# Patient Record
Sex: Female | Born: 2000
Health system: Southern US, Community
[De-identification: ages and names within clinical notes are randomized; demographics above are authoritative.]

## PROBLEM LIST (undated history)

## (undated) DIAGNOSIS — A599 Trichomoniasis, unspecified: Secondary | ICD-10-CM

## (undated) DIAGNOSIS — Z789 Other specified health status: Secondary | ICD-10-CM

## (undated) DIAGNOSIS — D649 Anemia, unspecified: Secondary | ICD-10-CM

## (undated) HISTORY — DX: Other specified health status: Z78.9

## (undated) HISTORY — PX: NO PAST SURGERIES: SHX2092

## (undated) HISTORY — DX: Trichomoniasis, unspecified: A59.9

---
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
## 2018-04-11 IMAGING — US US OB COMP LESS 14 WK
1 series · 15 of 28 positions shown · non-contrast
Comparison: None.

CLINICAL DATA: Viability, early pregnancy

EXAM:
OBSTETRIC <14 WK ULTRASOUND
TECHNIQUE: Transabdominal ultrasound was performed for evaluation of the
gestation as well as the maternal uterus and adnexal regions.

[Series 1: us ob comp less 14 wk · 15 of 31 slices shown]
[im 1/31]
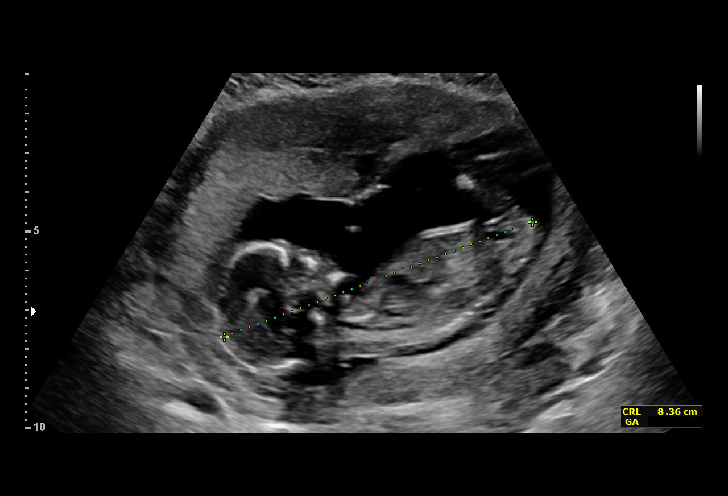
[im 3/31]
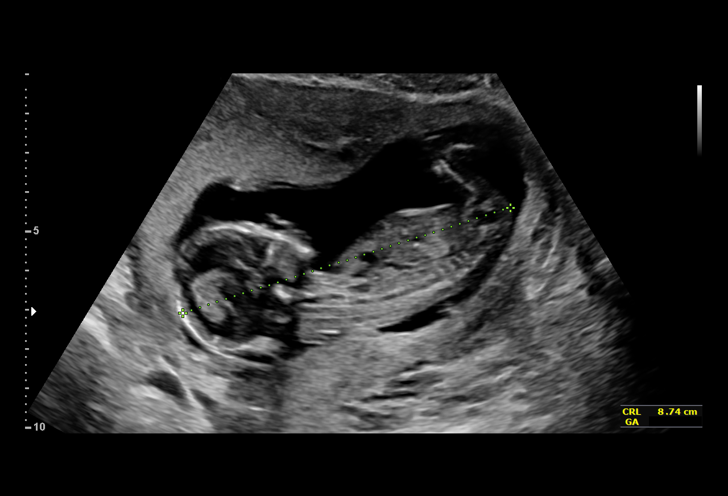
[im 5/31]
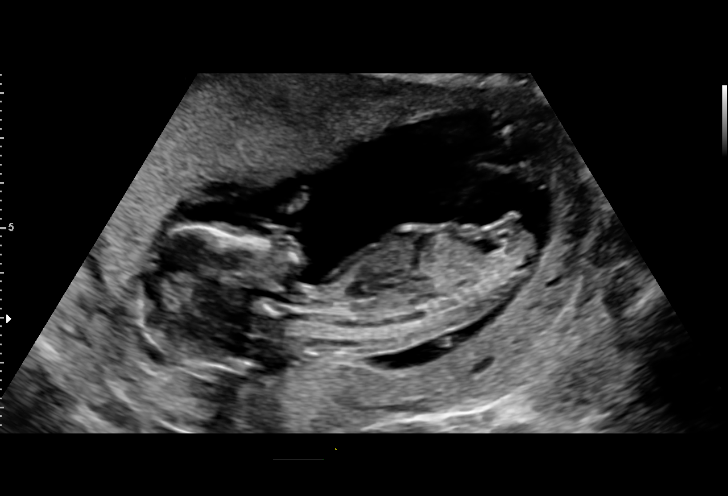
[im 7/31]
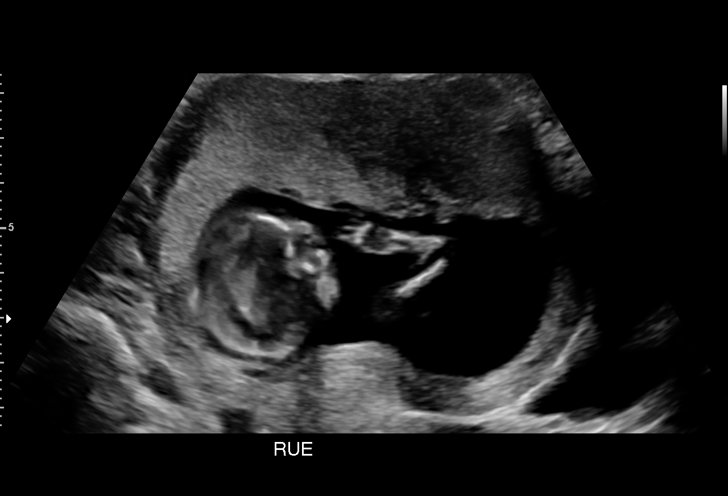
[im 9/31]
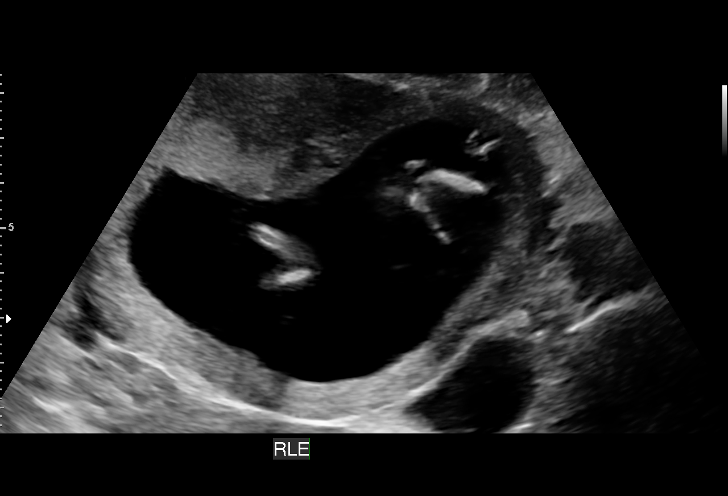
[im 12/31]
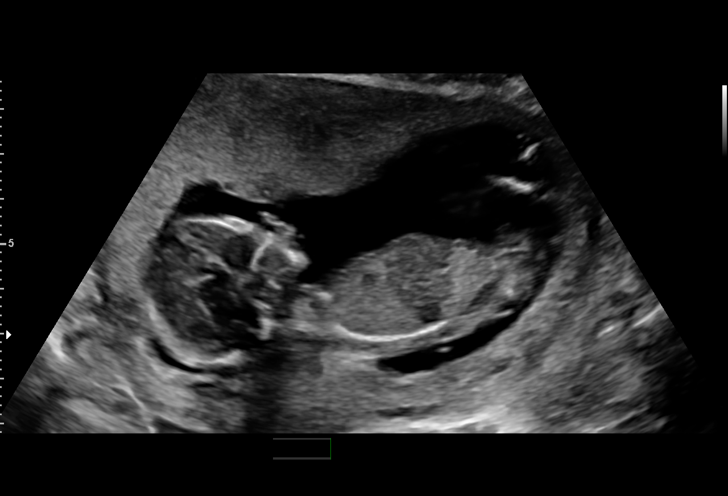
[im 14/31]
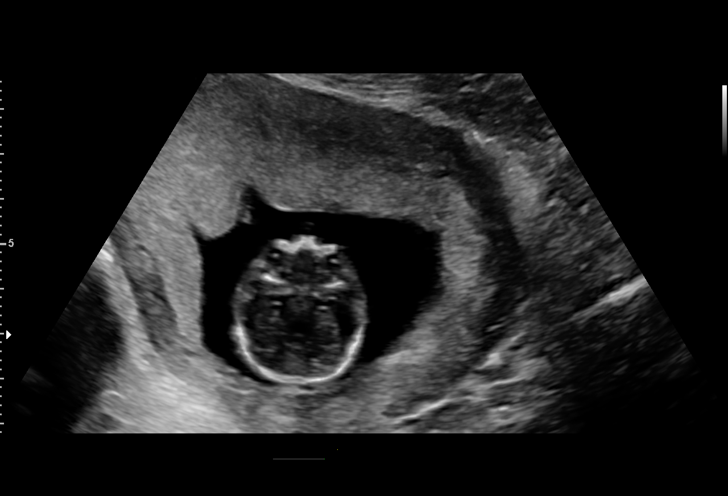
[im 16/31]
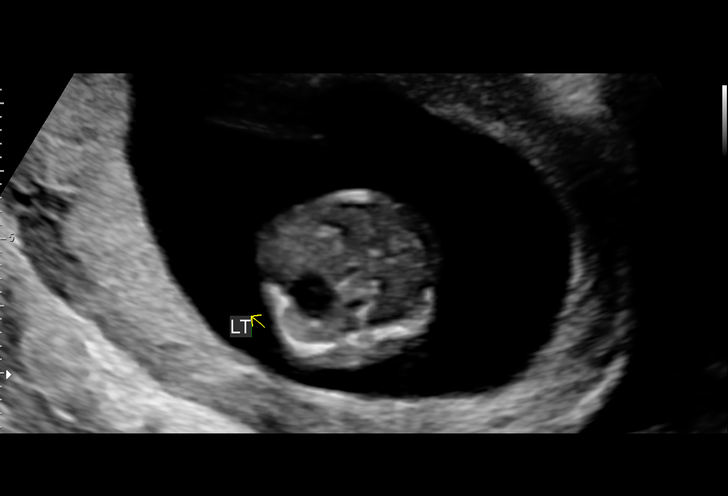
[im 17/31]
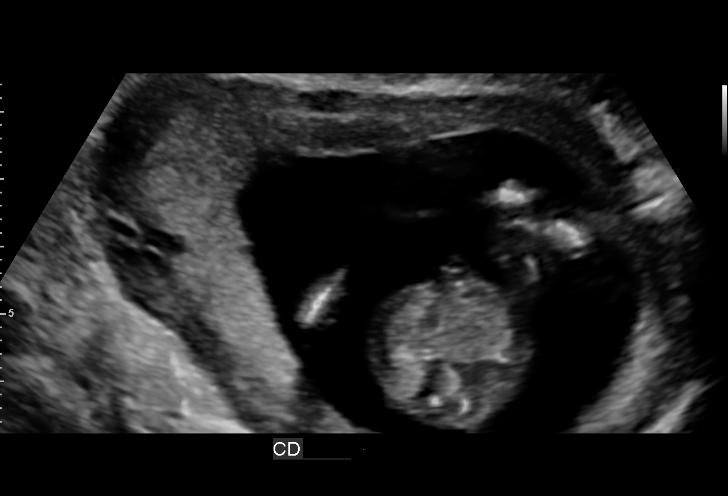
[im 19/31]
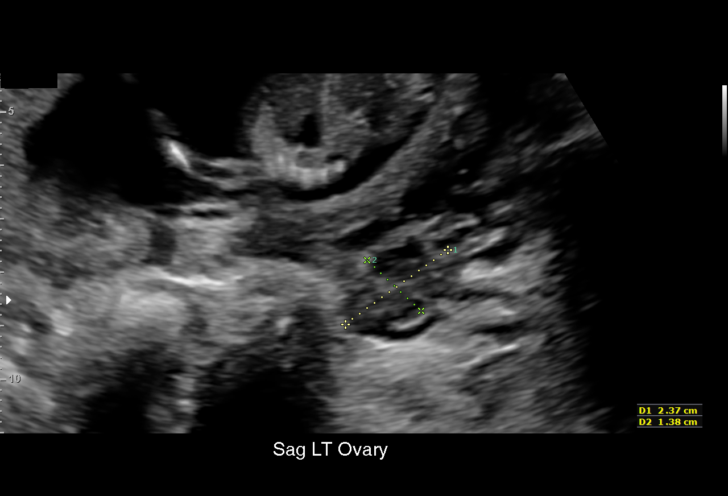
[im 22/31]
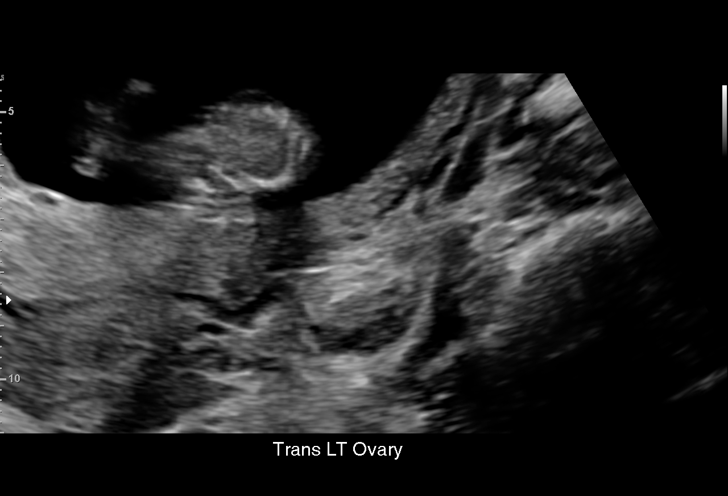
[im 24/31]
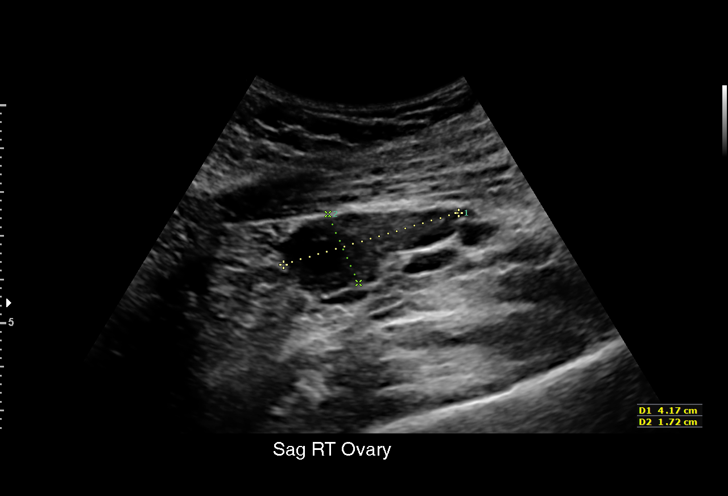
[im 26/31]
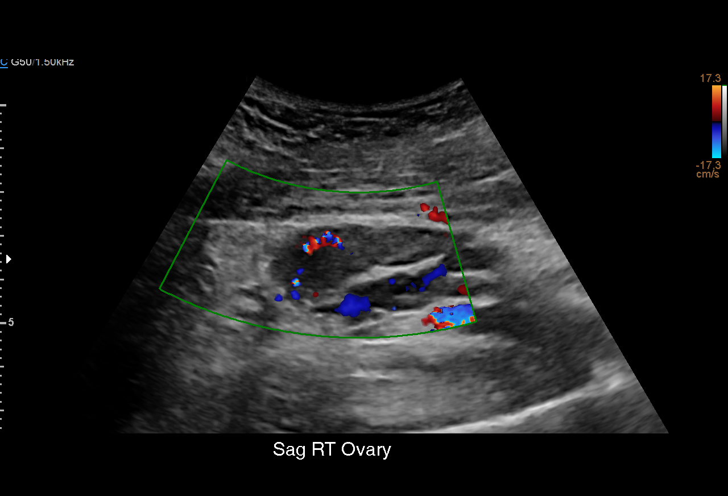
[im 28/31]
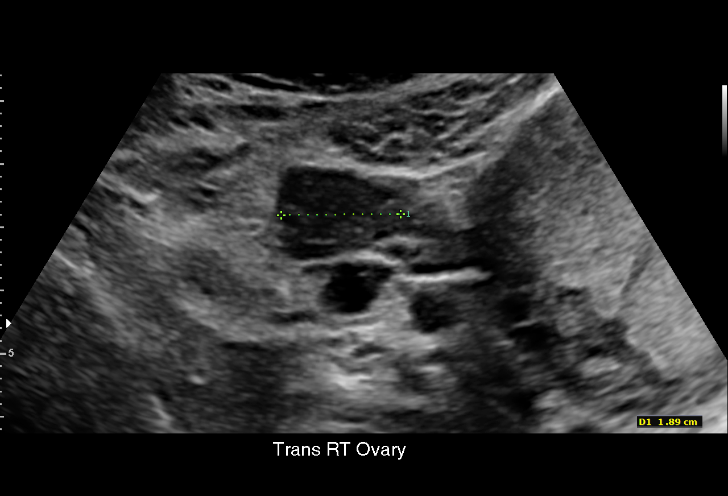
[im 31/31]
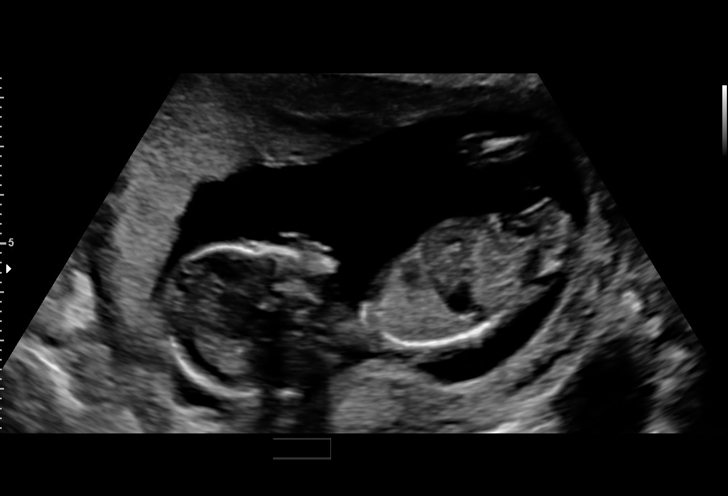

[15 of 28 positions shown; findings below may reference images not displayed]

FINDINGS: Intrauterine gestational sac: Visualized, single

Yolk sac:  Not visualized

Embryo:  Visualized

Cardiac Activity: Visualized

Heart Rate: 152 bpm

MSD:  Mm    w     d

CRL:   85.5  mm   14 w 3 d                  US EDC: 09/10/2016

Subchorionic hemorrhage:  None visualized.

Maternal uterus/adnexae: No adnexal masses or free fluid.
IMPRESSION: Fourteen week 3 day intrauterine pregnancy by crown-rump length.
Fetal heart rate 152 beats per minute. No acute maternal findings.

## 2018-05-26 IMAGING — US US MFM OB TRANSVAGINAL
1 series · 14 of 28 positions shown · non-contrast
Comparison: none

[Series 1: us mfm ob transvaginal · 14 of 102 slices shown]
[im 4/102]
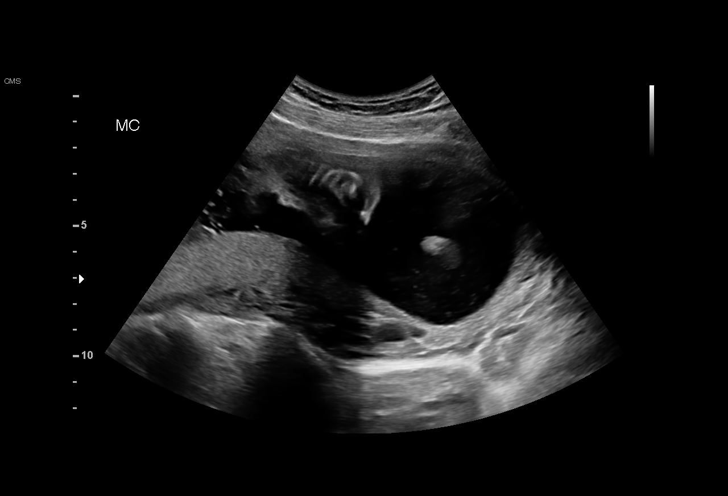
[im 12/102]
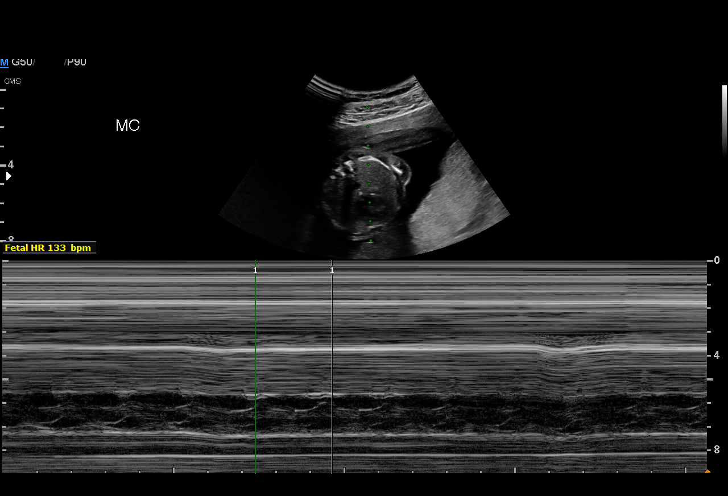
[im 19/102]
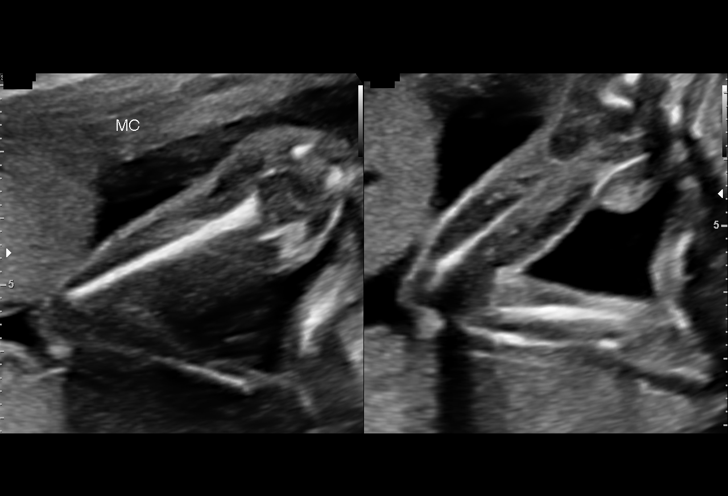
[im 27/102]
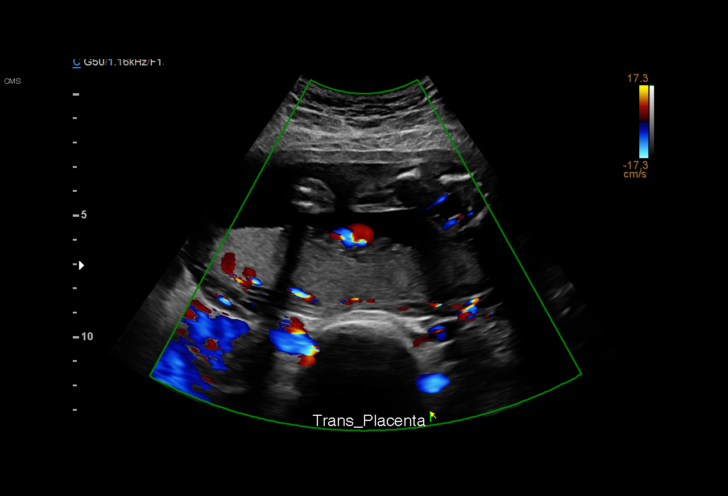
[im 34/102]
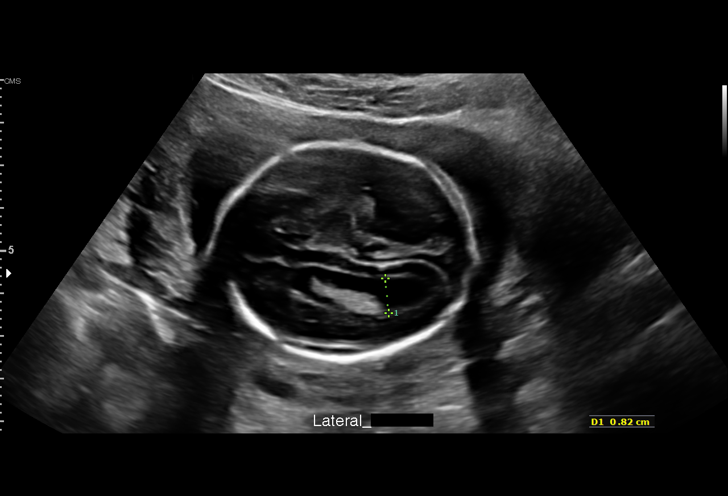
[im 42/102]
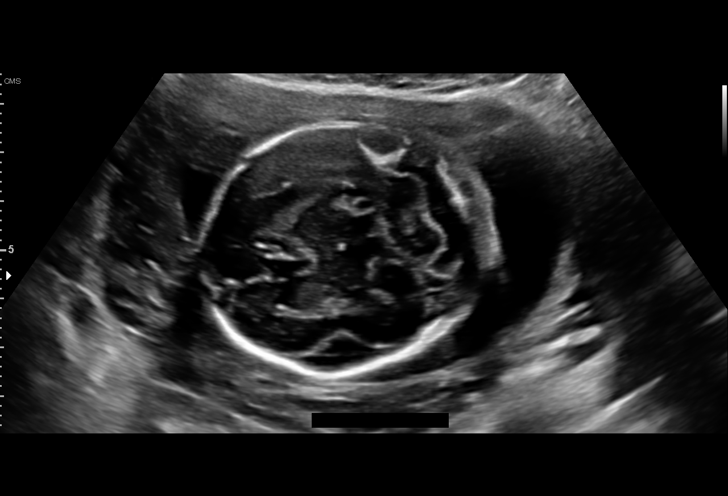
[im 49/102]
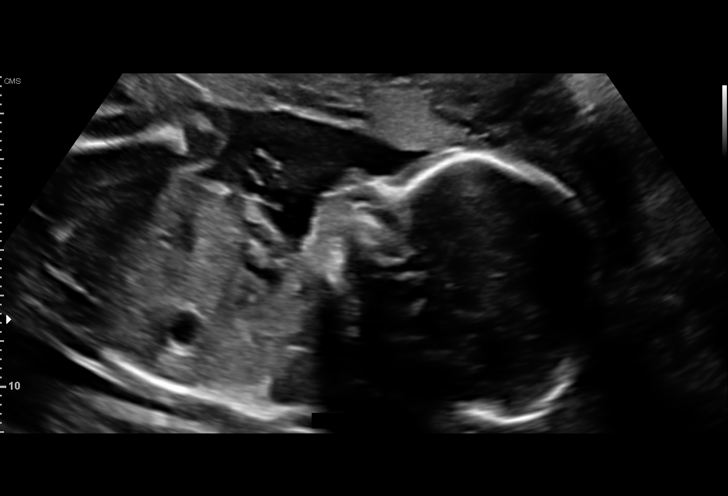
[im 57/102]
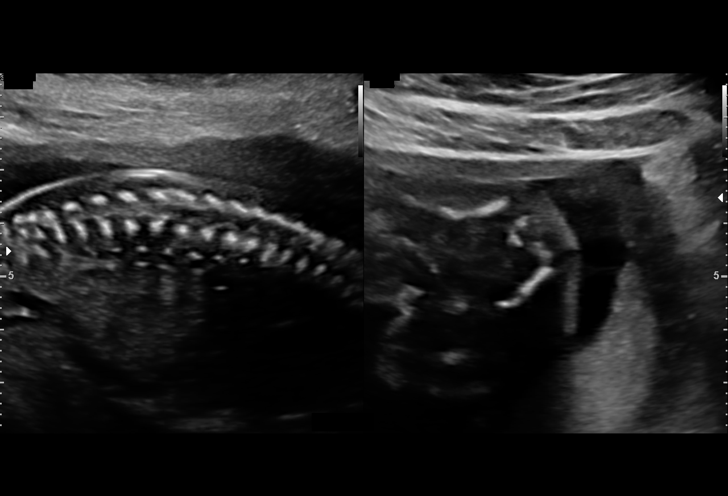
[im 64/102]
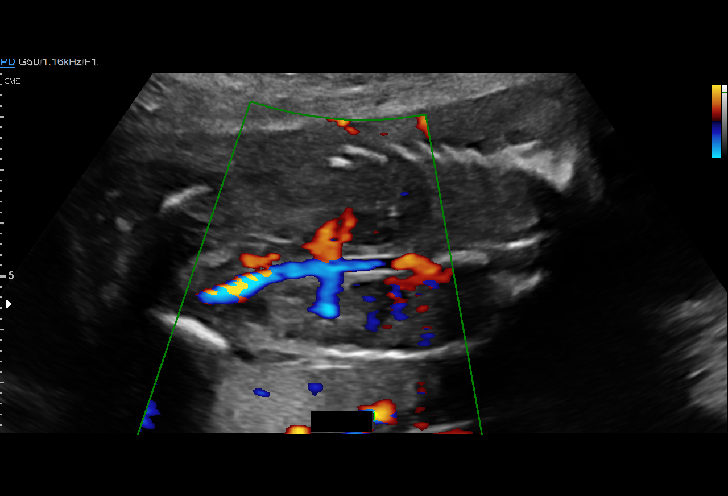
[im 72/102]
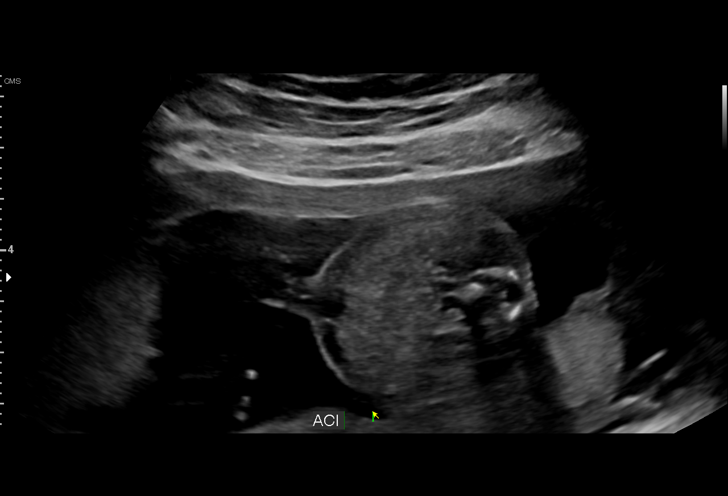
[im 79/102]
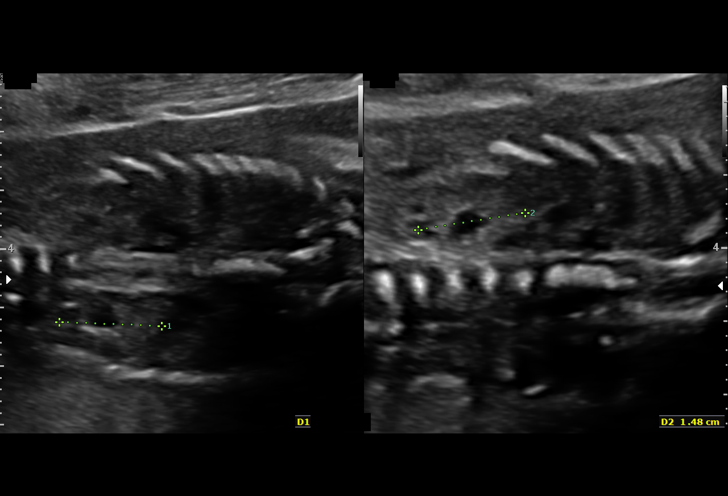
[im 87/102]
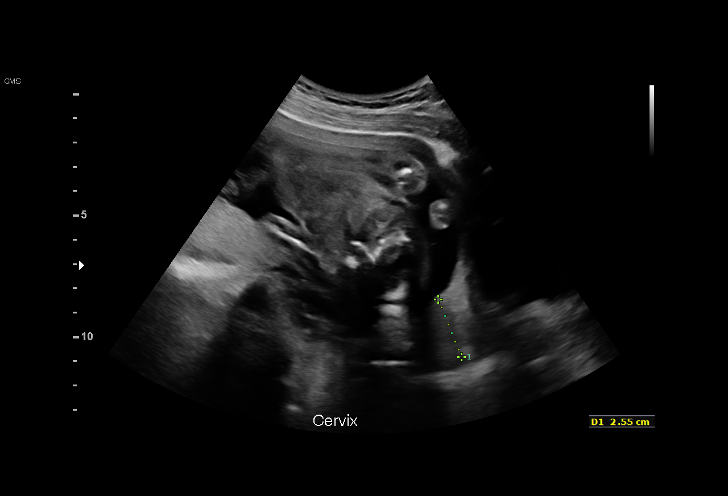
[im 94/102]
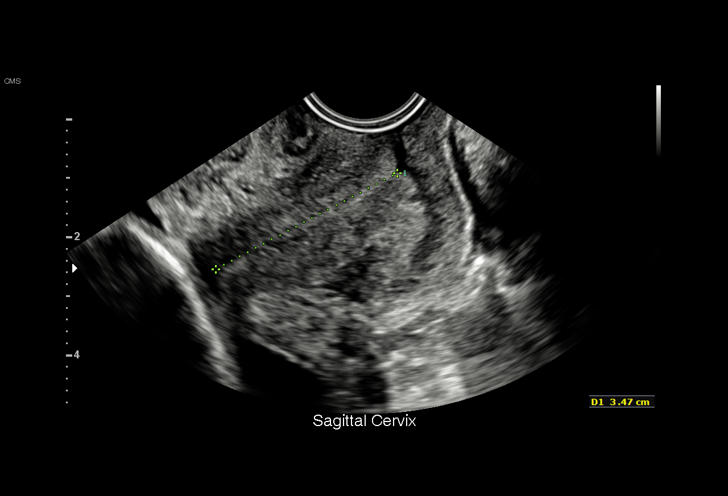
[im 102/102]
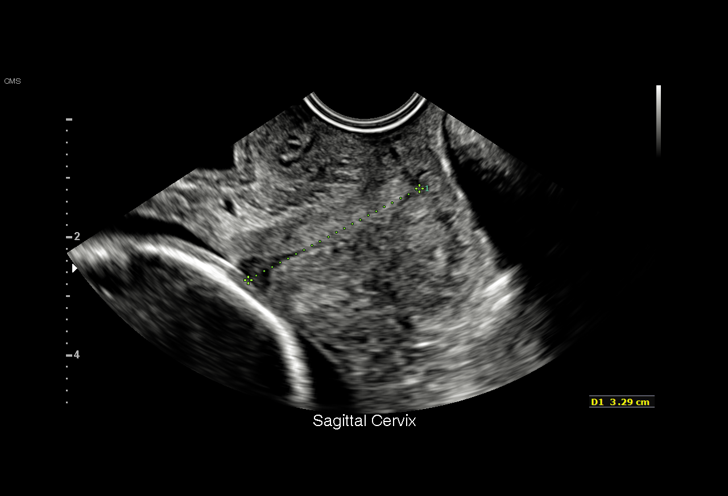

[14 of 28 positions shown; findings below may reference images not displayed]

[id number] [REDACTED]

1  DENG MAXWELL              [id number]      [id number]     [id number]
2  PAULUS N CEEJAY            [id number]      [id number]     [id number]
Indications

20 weeks gestation of pregnancy
Encounter for fetal anatomic survey
Encounter for cervical length
Fetal Evaluation

Num Of Fetuses:     1
Fetal Heart         133
Rate(bpm):
Cardiac Activity:   Observed
Presentation:       Cephalic
Placenta:           Posterior, above cervical os
P. Cord Insertion:  Visualized, central

Amniotic Fluid
AFI FV:      Subjectively within normal limits

Largest Pocket(cm)
4.19
Biometry

BPD:      49.9  mm     G. Age:  21w 0d         59  %    CI:         73.73  %    70 - 86
FL/HC:       19.0  %    15.9 -
HC:      184.6  mm     G. Age:  20w 6d         39  %    HC/AC:       1.18       1.06 -
AC:        156  mm     G. Age:  20w 6d         41  %    FL/BPD:      70.3  %
FL:       35.1  mm     G. Age:  21w 0d         49  %    FL/AC:       22.5  %    20 - 24
HUM:      32.8  mm     G. Age:  21w 0d         53  %
CER:      20.4  mm     G. Age:  19w 3d         17  %
CM:        5.1  mm

Est. FW:     386   gm    0 lb 14 oz     46  %
Gestational Age

U/S Today:     21w 0d                                        EDD:    09/09/16
Best:          20w 6d     Det. By:  Previous Ultrasound      EDD:    09/10/16
(03/15/16)
Anatomy

Cranium:               Appears normal         Aortic Arch:            Appears normal
Cavum:                 Appears normal         Ductal Arch:            Appears normal
Ventricles:            Appears normal         Diaphragm:              Appears normal
Choroid Plexus:        Appears normal         Stomach:                Appears normal, left
sided
Cerebellum:            Appears normal         Abdomen:                Appears normal
Posterior Fossa:       Appears normal         Abdominal Wall:         Appears nml (cord
insert, abd wall)
Nuchal Fold:           Not applicable (>20    Cord Vessels:           Appears normal (3
wks GA)                                        vessel cord)
Face:                  Appears normal         Kidneys:                Appear normal
(orbits and profile)
Lips:                  Appears normal         Bladder:                Appears normal
Thoracic:              Appears normal         Spine:                  Appears normal
Heart:                 Appears normal         Upper Extremities:      Appears normal
(4CH, axis, and
situs)
RVOT:                  Appears normal         Lower Extremities:      Appears normal
LVOT:                  Appears normal

Other:  Fetus appears to be a female. Heels and 5th digit visualized. Nasal
bone visualized. Open hands visualized.
Cervix Uterus Adnexa

Cervix
Length:           3.14  cm.
Normal appearance by transvaginal scan

Uterus
No abnormality visualized.

Left Ovary
Within normal limits.

Right Ovary
Within normal limits.

Cul De Sac:   No free fluid seen.

Adnexa:       No abnormality visualized.
Impression

SIUP at 20+6 weeks
Normal detailed fetal anatomy
Markers of aneuploidy: none
Normal amniotic fluid volume
Measurements consistent with prior US
EV views of cervix: normal length without funneling
Recommendations

Follow-up as clinically indicated

## 2018-06-17 ENCOUNTER — Other Ambulatory Visit (HOSPITAL_COMMUNITY): Payer: Self-pay | Admitting: Family Medicine

## 2018-06-17 LAB — PREGNANCY, URINE: Preg Test, Ur: POSITIVE — AB

## 2019-01-27 ENCOUNTER — Other Ambulatory Visit: Payer: Self-pay | Admitting: Family Medicine

## 2019-01-27 DIAGNOSIS — Z3201 Encounter for pregnancy test, result positive: Secondary | ICD-10-CM

## 2019-02-01 ENCOUNTER — Encounter: Payer: Medicaid Other | Admitting: Obstetrics & Gynecology

## 2019-02-05 ENCOUNTER — Ambulatory Visit: Payer: Medicaid Other

## 2019-02-05 ENCOUNTER — Ambulatory Visit: Admission: RE | Admit: 2019-02-05 | Payer: Self-pay | Source: Ambulatory Visit

## 2019-02-12 ENCOUNTER — Ambulatory Visit: Payer: Medicaid Other

## 2019-02-17 ENCOUNTER — Other Ambulatory Visit: Payer: Self-pay | Admitting: Family Medicine

## 2019-02-17 DIAGNOSIS — Z3687 Encounter for antenatal screening for uncertain dates: Secondary | ICD-10-CM

## 2019-02-17 DIAGNOSIS — Z3201 Encounter for pregnancy test, result positive: Secondary | ICD-10-CM

## 2019-02-17 DIAGNOSIS — Z3490 Encounter for supervision of normal pregnancy, unspecified, unspecified trimester: Secondary | ICD-10-CM

## 2019-02-18 ENCOUNTER — Other Ambulatory Visit: Payer: Self-pay

## 2019-02-18 ENCOUNTER — Ambulatory Visit
Admission: RE | Admit: 2019-02-18 | Discharge: 2019-02-18 | Disposition: A | Discharge status: Home / Self Care | Payer: Medicaid Other | Source: Ambulatory Visit | Attending: Family Medicine | Admitting: Family Medicine

## 2019-02-18 DIAGNOSIS — Z3201 Encounter for pregnancy test, result positive: Secondary | ICD-10-CM | POA: Diagnosis present

## 2019-02-18 DIAGNOSIS — Z3490 Encounter for supervision of normal pregnancy, unspecified, unspecified trimester: Secondary | ICD-10-CM | POA: Insufficient documentation

## 2019-02-18 DIAGNOSIS — Z3687 Encounter for antenatal screening for uncertain dates: Secondary | ICD-10-CM | POA: Diagnosis present

## 2019-02-23 ENCOUNTER — Other Ambulatory Visit: Payer: Self-pay

## 2019-02-23 DIAGNOSIS — Z20822 Contact with and (suspected) exposure to covid-19: Secondary | ICD-10-CM

## 2019-02-25 LAB — NOVEL CORONAVIRUS, NAA: SARS-CoV-2, NAA: NOT DETECTED

## 2019-03-19 NOTE — L&D Delivery Note (Addendum)
OB/GYN Faculty Practice Delivery Note  Kayla Poole is a 19 y.o. [id number] s/p VD at [redacted]w[redacted]d. She was admitted for SROM.Marland Kitchen   ROM: 12h 26m with clear fluid GBS Status: Negative/-- (07/01 0000) Maximum Maternal Temperature: 98.15F  Labor Progress: . Initial SVE: 3.5/70/-2. Patient received Pitocin. She then progressed to complete.   Delivery Date/Time: 7/6 @ 2306 Delivery: Called to room and patient was complete and pushing. Head delivered in ROA position. Tight nuchal cord present and reduced. Shoulder and body delivered in usual fashion. Infant with spontaneous cry, placed on mother's abdomen, dried and stimulated. Cord clamped x 2 after 1-minute delay, and cut by MGM. Cord blood drawn. Placenta delivered spontaneously with gentle cord traction. Fundus firm with massage and Pitocin. Labia, perineum, vagina, and cervix inspected inspected with no lacerations.  Baby Weight: 3445g  Placenta: Sent to L&D Complications: None Lacerations: None EBL: 221 mL Analgesia: None   Infant:  APGAR (1 MIN): 7   APGAR (5 MINS): 9   APGAR (10 MINS):     Kayla Birkenhead, MD [facility] Family Medicine Fellow, [facility] for [facility], [facility] 09/21/2019, 11:39 PM

## 2019-03-26 ENCOUNTER — Other Ambulatory Visit: Payer: Self-pay | Admitting: Family Medicine

## 2019-03-26 DIAGNOSIS — Z3481 Encounter for supervision of other normal pregnancy, first trimester: Secondary | ICD-10-CM

## 2019-04-26 ENCOUNTER — Other Ambulatory Visit: Payer: Self-pay

## 2019-04-26 ENCOUNTER — Ambulatory Visit
Admission: RE | Admit: 2019-04-26 | Discharge: 2019-04-26 | Disposition: A | Discharge status: Home / Self Care | Payer: Medicaid Other | Source: Ambulatory Visit | Attending: Family Medicine | Admitting: Family Medicine

## 2019-04-26 DIAGNOSIS — Z3481 Encounter for supervision of other normal pregnancy, first trimester: Secondary | ICD-10-CM | POA: Diagnosis not present

## 2019-09-16 LAB — OB RESULTS CONSOLE GBS: GBS: NEGATIVE

## 2019-09-21 ENCOUNTER — Inpatient Hospital Stay (HOSPITAL_COMMUNITY)
Admission: AD | Admit: 2019-09-21 | Discharge: 2019-09-23 | DRG: 807 | Disposition: A | Discharge status: Home / Self Care | Payer: Medicaid Other | Attending: Obstetrics and Gynecology | Admitting: Obstetrics and Gynecology

## 2019-09-21 ENCOUNTER — Other Ambulatory Visit: Payer: Self-pay

## 2019-09-21 ENCOUNTER — Encounter (HOSPITAL_COMMUNITY): Payer: Self-pay | Admitting: Obstetrics and Gynecology

## 2019-09-21 DIAGNOSIS — O1490 Unspecified pre-eclampsia, unspecified trimester: Secondary | ICD-10-CM

## 2019-09-21 DIAGNOSIS — O1494 Unspecified pre-eclampsia, complicating childbirth: Secondary | ICD-10-CM | POA: Diagnosis present

## 2019-09-21 DIAGNOSIS — Z20822 Contact with and (suspected) exposure to covid-19: Secondary | ICD-10-CM | POA: Diagnosis present

## 2019-09-21 DIAGNOSIS — Z3A39 39 weeks gestation of pregnancy: Secondary | ICD-10-CM

## 2019-09-21 DIAGNOSIS — Z3490 Encounter for supervision of normal pregnancy, unspecified, unspecified trimester: Secondary | ICD-10-CM

## 2019-09-21 DIAGNOSIS — O4290 Premature rupture of membranes, unspecified as to length of time between rupture and onset of labor, unspecified weeks of gestation: Secondary | ICD-10-CM | POA: Diagnosis present

## 2019-09-21 DIAGNOSIS — O26893 Other specified pregnancy related conditions, third trimester: Secondary | ICD-10-CM | POA: Diagnosis present

## 2019-09-21 DIAGNOSIS — O4292 Full-term premature rupture of membranes, unspecified as to length of time between rupture and onset of labor: Principal | ICD-10-CM | POA: Diagnosis present

## 2019-09-21 HISTORY — DX: Anemia, unspecified: D64.9

## 2019-09-21 LAB — COMPREHENSIVE METABOLIC PANEL
ALT: 12 U/L (ref 0–44)
AST: 16 U/L (ref 15–41)
Albumin: 2.7 g/dL — ABNORMAL LOW (ref 3.5–5.0)
Alkaline Phosphatase: 194 U/L — ABNORMAL HIGH (ref 38–126)
Anion gap: 12 (ref 5–15)
BUN: 6 mg/dL (ref 6–20)
CO2: 18 mmol/L — ABNORMAL LOW (ref 22–32)
Calcium: 8.6 mg/dL — ABNORMAL LOW (ref 8.9–10.3)
Chloride: 107 mmol/L (ref 98–111)
Creatinine, Ser: 0.45 mg/dL (ref 0.44–1.00)
GFR calc Af Amer: >60 mL/min (ref >60)
GFR calc non Af Amer: >60 mL/min (ref >60)
Glucose, Bld: 79 mg/dL (ref 70–99)
Potassium: 3.6 mmol/L (ref 3.5–5.1)
Sodium: 137 mmol/L (ref 135–145)
Total Bilirubin: 0.5 mg/dL (ref 0.3–1.2)
Total Protein: 6.2 g/dL — ABNORMAL LOW (ref 6.5–8.1)

## 2019-09-21 LAB — TYPE AND SCREEN
ABO/RH(D): O POS
Antibody Screen: NEGATIVE

## 2019-09-21 LAB — CBC
HCT: 30.7 % — ABNORMAL LOW (ref 36.0–46.0)
Hemoglobin: 8.9 g/dL — ABNORMAL LOW (ref 12.0–15.0)
MCH: 22.5 pg — ABNORMAL LOW (ref 26.0–34.0)
MCHC: 29 g/dL — ABNORMAL LOW (ref 30.0–36.0)
MCV: 77.7 fL — ABNORMAL LOW (ref 80.0–100.0)
Platelets: 275 10*3/uL (ref 150–400)
RBC: 3.95 MIL/uL (ref 3.87–5.11)
RDW: 18 % — ABNORMAL HIGH (ref 11.5–15.5)
WBC: 14.5 10*3/uL — ABNORMAL HIGH (ref 4.0–10.5)
nRBC: 0.2 % (ref 0.0–0.2)

## 2019-09-21 LAB — PROTEIN / CREATININE RATIO, URINE
Creatinine, Urine: 126.54 mg/dL
Protein Creatinine Ratio: 0.36 mg/mg{Cre} — ABNORMAL HIGH (ref 0.00–0.15)
Total Protein, Urine: 46 mg/dL

## 2019-09-21 LAB — SARS CORONAVIRUS 2 BY RT PCR (HOSPITAL ORDER, PERFORMED IN ~~LOC~~ HOSPITAL LAB): SARS Coronavirus 2: NEGATIVE

## 2019-09-21 LAB — ABO/RH: ABO/RH(D): O POS

## 2019-09-21 LAB — POCT FERN TEST: POCT Fern Test: POSITIVE

## 2019-09-21 MED ORDER — OXYTOCIN-SODIUM CHLORIDE 30-0.9 UT/500ML-% IV SOLN
1.0000 m[IU]/min | INTRAVENOUS | Status: DC
  Administered 2019-09-21: 2 m[IU]/min via INTRAVENOUS

## 2019-09-21 MED ORDER — LACTATED RINGERS IV SOLN: INTRAVENOUS | Status: DC

## 2019-09-21 MED ORDER — SOD CITRATE-CITRIC ACID 500-334 MG/5ML PO SOLN: 30.0000 mL | ORAL | Status: DC | PRN

## 2019-09-21 MED ORDER — LIDOCAINE HCL (PF) 1 % IJ SOLN: 30.0000 mL | INTRAMUSCULAR | Status: DC | PRN

## 2019-09-21 MED ORDER — OXYTOCIN BOLUS FROM INFUSION
333.0000 mL | Freq: Once | INTRAVENOUS | Status: AC
  Administered 2019-09-21: 333 mL via INTRAVENOUS

## 2019-09-21 MED ORDER — FENTANYL CITRATE (PF) 100 MCG/2ML IJ SOLN
50.0000 ug | INTRAMUSCULAR | Status: DC | PRN
  Administered 2019-09-21 (×2): 50 ug via INTRAVENOUS
  Filled 2019-09-21 (×2): qty 2

## 2019-09-21 MED ORDER — DIPHENHYDRAMINE HCL 50 MG/ML IJ SOLN: 12.5000 mg | INTRAMUSCULAR | Status: DC | PRN

## 2019-09-21 MED ORDER — LACTATED RINGERS IV SOLN: 500.0000 mL | INTRAVENOUS | Status: DC | PRN

## 2019-09-21 MED ORDER — ONDANSETRON HCL 4 MG/2ML IJ SOLN: 4.0000 mg | Freq: Four times a day (QID) | INTRAMUSCULAR | Status: DC | PRN

## 2019-09-21 MED ORDER — EPHEDRINE 5 MG/ML INJ: 10.0000 mg | INTRAVENOUS | Status: DC | PRN

## 2019-09-21 MED ORDER — LACTATED RINGERS IV SOLN: 500.0000 mL | Freq: Once | INTRAVENOUS | Status: DC

## 2019-09-21 MED ORDER — PHENYLEPHRINE 40 MCG/ML (10ML) SYRINGE FOR IV PUSH (FOR BLOOD PRESSURE SUPPORT): 80.0000 ug | PREFILLED_SYRINGE | INTRAVENOUS | Status: DC | PRN

## 2019-09-21 MED ORDER — ACETAMINOPHEN 325 MG PO TABS: 650.0000 mg | ORAL_TABLET | ORAL | Status: DC | PRN

## 2019-09-21 MED ORDER — TERBUTALINE SULFATE 1 MG/ML IJ SOLN: 0.2500 mg | Freq: Once | INTRAMUSCULAR | Status: DC | PRN

## 2019-09-21 MED ORDER — FENTANYL-BUPIVACAINE-NACL 0.5-0.125-0.9 MG/250ML-% EP SOLN: 12.0000 mL/h | EPIDURAL | Status: DC | PRN

## 2019-09-21 MED ORDER — OXYTOCIN-SODIUM CHLORIDE 30-0.9 UT/500ML-% IV SOLN
2.5000 [IU]/h | INTRAVENOUS | Status: DC
  Administered 2019-09-21: 2.5 [IU]/h via INTRAVENOUS
  Filled 2019-09-21: qty 500

## 2019-09-21 NOTE — Progress Notes (Signed)
Labor Progress Note Kayla Poole is a 19 y.o. G3P2002 at 35w5dpresented for SROM. S: Starting to feel ctx. Met patient and discussed plan.   O:  BP 123/85   Pulse 98   Temp 98.4 F (36.9 C) (Oral)   Resp 16   Ht _0  (1.727 m)   Wt 86.7 kg   LMP  (LMP Unknown)   SpO2 99%   BMI 29.07 kg/m  EFM: 150, moderate variability, pos accels, no decels, reactive TOCO: q5-669mCVE: Dilation: 4 Effacement (%): 70 Cervical Position: Middle Station: -2 Presentation: Vertex Exam by:: TaGinnie SmartRN    A&P: [age].o. [id number] for SROM. #Labor: Progressing well. Cont Pitocin. Anticipate SVD. #Pain: per patient request #FWB: Cat I #GBS negative #Elevated BP's: Two BP's elevated on admission but not yet > 4 hours apart. Pr/Cr 0.36 with normal CBC and CMP. If has elevated BP's after 2221; meets criteria for Mild Pre-E.  CheChauncey MannD 8:57 PM

## 2019-09-21 NOTE — Discharge Summary (Signed)
Postpartum Discharge Summary     Patient Name: Kayla Poole DOB: [birth year] MRN: [id number]  Date of admission: 09/21/2019 Delivery date:09/21/2019  Delivering provider: Chauncey Mann  Date of discharge: 09/23/2019  Admitting diagnosis: PROM (premature rupture of membranes) [O42.90] Intrauterine pregnancy: [redacted]w[redacted]d    Secondary diagnosis:  Active Problems:   Supervision of normal pregnancy   PROM (premature rupture of membranes)   Preeclampsia  Additional problems: None    Discharge diagnosis: Term Pregnancy Delivered                                              Post partum procedures:None Augmentation: Pitocin Complications: None  Hospital course: Onset of Labor With Vaginal Delivery      19y.o. yo [id number] 21w5das admitted in Latent Labor on 09/21/2019. Patient had an uncomplicated labor course as follows: Initial SVE: 3.5/70/-2. Patient received Pitocin. She then progressed to complete.  Membrane Rupture Time/Date: 10:30 AM ,09/21/2019   Delivery Method:Vaginal, Spontaneous  Episiotomy: None  Lacerations:  None  Patient had an uncomplicated postpartum course. Norvasc 5 mg initiated and prescribed on discharge for mild range BP's; [facility]. She is ambulating, tolerating a regular diet, passing flatus, and urinating well. Patient is discharged home in stable condition on 09/23/19.  Newborn Data: Birth date:09/21/2019  Birth time:11:06 PM  Gender:Female  Living status:Living  Apgars:7 ,9  Weight:3445 g   Magnesium Sulfate received: No BMZ received: No Rhophylac:N/A MMR:N/A T-DaP:Given prenatally Flu: No Transfusion:No  Physical exam  Vitals:   09/22/19 0603 09/22/19 0921 09/22/19 1402 09/22/19 2029  BP: 135/80 (!) 108/51 (!) 138/91 126/84  Pulse: 79 61 68 77  Resp: _0 Temp: 98.4 F (36.9 C) 98.4 F (36.9 C) 98.4 F (36.9 C) 98.2 F (36.8 C)  TempSrc: Oral Oral Oral Oral  SpO2: 100%  100% 100%  Weight:       Height:       General: alert, cooperative and no distress Lochia: appropriate Uterine Fundus: firm Incision: N/A DVT Evaluation: No evidence of DVT seen on physical exam. Labs: Lab Results  Component Value Date   WBC 14.5 (H) 09/21/2019   HGB 8.9 (L) 09/21/2019   HCT 30.7 (L) 09/21/2019   MCV 77.7 (L) 09/21/2019   PLT 275 09/21/2019   CMP Latest Ref Rng & Units 09/21/2019  Glucose 70 - 99 mg/dL 79  BUN 6 - 20 mg/dL 6  Creatinine 0.44 - 1.00 mg/dL 0.45  Sodium 135 - 145 mmol/L 137  Potassium 3.5 - 5.1 mmol/L 3.6  Chloride 98 - 111 mmol/L 107  CO2 22 - 32 mmol/L 18(L)  Calcium 8.9 - 10.3 mg/dL 8.6(L)  Total Protein 6.5 - 8.1 g/dL 6.2(L)  Total Bilirubin 0.3 - 1.2 mg/dL 0.5  Alkaline Phos 38 - 126 U/L 194(H)  AST 15 - 41 U/L 16  ALT 0 - 44 U/L 12   Edinburgh Score: Edinburgh Postnatal Depression Scale Screening Tool 09/22/2019  I have been able to laugh and see the funny side of things. 0  I have looked forward with enjoyment to things. 0  I have blamed myself unnecessarily when things went wrong. 0  I have been anxious or worried for no good reason. 0  I have felt scared or panicky for no good  reason. 0  Things have been getting on top of me. 0  I have been so unhappy that I have had difficulty sleeping. 0  I have felt sad or miserable. 0  I have been so unhappy that I have been crying. 0  The thought of harming myself has occurred to me. 0  Edinburgh Postnatal Depression Scale Total 0     After visit meds:  Allergies as of 09/23/2019   No Known Allergies     Medication List    TAKE these medications   amLODipine 5 MG tablet Commonly known as: NORVASC Take 1 tablet (5 mg total) by mouth daily.   ferrous sulfate 325 (65 FE) MG tablet Take 1 tablet (325 mg total) by mouth daily with breakfast.   ibuprofen 600 MG tablet Commonly known as: ADVIL Take 1 tablet (600 mg total) by mouth every 8 (eight) hours as needed for mild pain.   PRENATAL VITAMIN PO Take by  mouth.   senna-docusate 8.6-50 MG tablet Commonly known as: Senokot-S Take 2 tablets by mouth daily. Start taking on: September 24, 2019        Discharge home in stable condition Infant Feeding: Breast Infant Disposition:home with mother Discharge instruction: per After Visit Summary and Postpartum booklet. Activity: Advance as tolerated. Pelvic rest for 6 weeks.  Diet: routine diet Future Appointments:No future appointments. Follow up Visit:  Patient to schedule f/u with outside [facility] provider.   09/23/2019 Chauncey Mann, MD

## 2019-09-21 NOTE — H&P (Signed)
LABOR AND DELIVERY ADMISSION HISTORY AND PHYSICAL NOTE  Kayla Poole is a 19 y.o. female G23P2002 with IUP at [redacted]w[redacted]d by 9wk [place] for SROM.   Reports she had clear gush of fluid around 1030 and has been leaking since then Intermittent mild contractions since then Denies vaginal bleeding, endorses positive fetal movement  She plans on bottle feeding. Her contraception plan is: undecided.  Prenatal History/Complications: [facility] [place]:  @[redacted]w[redacted]d , CWD, normal anatomy, cephalic presentation, fundal/posterior placenta, percentile/EFW not listed  Pregnancy complications:  - none reported  Past Medical History: Past Medical History:  Diagnosis Date   Anemia    Medical history non-contributory    Trichomonas infection     Past Surgical History: Past Surgical History:  Procedure Laterality Date   NO PAST SURGERIES      Obstetrical History: OB History    Gravida  3   Para  2   Term  2   Preterm      AB      Living  2     SAB      TAB      Ectopic      Multiple  0   Live Births  2           Social History: Social History   Socioeconomic History   Marital status: Single    Spouse name: Not on file   Number of children: Not on file   Years of education: Not on file   Highest education level: Not on file  Occupational History   Not on file  Tobacco Use   Smoking status: Never Smoker   Smokeless tobacco: Never Used  Substance and Sexual Activity   Alcohol use: No   Drug use: No   Sexual activity: Yes    Birth control/protection: None  Other Topics Concern   Not on file  Social History Narrative   Not on file   Social Determinants of Health   Financial Resource Strain:    Difficulty of Paying Living Expenses:   Food Insecurity:    Worried About in the Last Year:    Programme researcher, broadcasting/film/video in the Last Year:   Transportation Needs:    Barista (Medical):    Lack of  Transportation (Non-Medical):   Physical Activity:    Days of Exercise per Week:    Minutes of Exercise per Session:   Stress:    Feeling of Stress :   Social Connections:    Frequency of Communication with Friends and Family:    Frequency of Social Gatherings with Friends and Family:    Attends Religious Services:    Active Member of Clubs or Organizations:    Attends Freight forwarder:    Marital Status:     Family History: Family History  Problem Relation Age of Onset   Cancer Father        lung   Cancer Paternal Uncle        lung    Allergies: No Known Allergies  Medications Prior to Admission  Medication Sig Dispense Refill Last Dose   Prenatal Vit-Fe Fumarate-FA (PRENATAL VITAMIN PO) Take by mouth.    at not taking     Review of Systems  All systems reviewed and negative except as stated in HPI  Physical Exam Blood pressure (!) 143/75, pulse (!) 114, temperature 98.6 F (37 C), temperature source Oral, height 5\' 8"  (1.727 m), weight 86.7  kg, SpO2 98 %, unknown if currently breastfeeding. General appearance: alert, oriented, NAD Lungs: normal respiratory effort Heart: regular rate Abdomen: soft, non-tender; gravid, leopolds 3500g Extremities: No calf swelling or tenderness Presentation: cephalic by RN SVE  Fetal monitoringBaseline: 150 bpm, Variability: Good {> 6 bpm), Accelerations: Reactive and Decelerations: Absent Uterine activity: irregular  Dilation: 3.5 Effacement (%): 70 Station: -2 Exam by:: TLYTLE RN   Prenatal labs: ABO, Rh:  O+ Antibody:  neg Rubella:  Immune RPR:   NR HBsAg:   Neg HIV:   Neg GC/Chlamydia: neg/neg  GBS:   neg 2-hr GTT: abnormal 1 hr, normal 3 hr Genetic screening:  No testing on file Anatomy US: normal per outside records  Prenatal Transfer Tool  Maternal Diabetes: No Genetic Screening: no records Maternal Ultrasounds/Referrals: Normal Fetal Ultrasounds or other Referrals:  None Maternal  Substance Abuse:  No Significant Maternal Medications:  None Significant Maternal Lab Results: Group B Strep negative  Results for orders placed or performed during the hospital encounter of 09/21/19 (from the past 24 hour(s))  POCT fern test   Collection Time: 09/21/19  6:46 PM  Result Value Ref Range   POCT Fern Test Positive = ruptured amniotic membanes     Patient Active Problem List   Diagnosis Date Noted   PROM (premature rupture of membranes) 09/21/2019   Supervision of normal pregnancy 08/21/2017    Assessment: Kayla Poole is a 19 y.o. G3P2002 at [redacted]w[redacted]d here for IOL for SROM.   #Labor: favorable cervix, start pitocin 2x2 #Pain: IV pain meds PRN, epidural upon request #FWB: Cat I #GBS/ID: neg per outside records #COVID: swab pending #MOF: Bottle #MOC: Undecided #Circ: n/a  #Elevated BP: multiple mild range BP's in MAU. Labs pending, ctm BP's.   Mary Sella [facility] 09/21/2019, 7:12 PM

## 2019-09-22 ENCOUNTER — Encounter (HOSPITAL_COMMUNITY): Payer: Self-pay | Admitting: Obstetrics and Gynecology

## 2019-09-22 DIAGNOSIS — O1490 Unspecified pre-eclampsia, unspecified trimester: Secondary | ICD-10-CM

## 2019-09-22 LAB — RPR: RPR Ser Ql: NONREACTIVE

## 2019-09-22 MED ORDER — ONDANSETRON HCL 4 MG/2ML IJ SOLN: 4.0000 mg | INTRAMUSCULAR | Status: DC | PRN

## 2019-09-22 MED ORDER — SIMETHICONE 80 MG PO CHEW: 80.0000 mg | CHEWABLE_TABLET | ORAL | Status: DC | PRN

## 2019-09-22 MED ORDER — TETANUS-DIPHTH-ACELL PERTUSSIS 5-2.5-18.5 LF-MCG/0.5 IM SUSP: 0.5000 mL | Freq: Once | INTRAMUSCULAR | Status: DC

## 2019-09-22 MED ORDER — IBUPROFEN 600 MG PO TABS
600.0000 mg | ORAL_TABLET | Freq: Three times a day (TID) | ORAL | Status: DC | PRN
  Administered 2019-09-22 – 2019-09-23 (×3): 600 mg via ORAL
  Filled 2019-09-22 (×3): qty 1

## 2019-09-22 MED ORDER — WITCH HAZEL-GLYCERIN EX PADS: 1.0000 "application " | MEDICATED_PAD | CUTANEOUS | Status: DC | PRN

## 2019-09-22 MED ORDER — COCONUT OIL OIL: 1.0000 "application " | TOPICAL_OIL | Status: DC | PRN

## 2019-09-22 MED ORDER — ACETAMINOPHEN 325 MG PO TABS
650.0000 mg | ORAL_TABLET | Freq: Four times a day (QID) | ORAL | Status: DC | PRN
  Administered 2019-09-22 – 2019-09-23 (×2): 650 mg via ORAL
  Filled 2019-09-22 (×2): qty 2

## 2019-09-22 MED ORDER — FERROUS SULFATE 325 (65 FE) MG PO TABS
325.0000 mg | ORAL_TABLET | Freq: Every day | ORAL | Status: DC
  Administered 2019-09-22: 325 mg via ORAL
  Filled 2019-09-22 (×2): qty 1

## 2019-09-22 MED ORDER — BENZOCAINE-MENTHOL 20-0.5 % EX AERO: 1.0000 "application " | INHALATION_SPRAY | CUTANEOUS | Status: DC | PRN

## 2019-09-22 MED ORDER — DIBUCAINE (PERIANAL) 1 % EX OINT: 1.0000 "application " | TOPICAL_OINTMENT | CUTANEOUS | Status: DC | PRN

## 2019-09-22 MED ORDER — ONDANSETRON HCL 4 MG PO TABS: 4.0000 mg | ORAL_TABLET | ORAL | Status: DC | PRN

## 2019-09-22 MED ORDER — AMLODIPINE BESYLATE 5 MG PO TABS: 5.0000 mg | ORAL_TABLET | Freq: Every day | ORAL | Status: DC

## 2019-09-22 MED ORDER — SENNOSIDES-DOCUSATE SODIUM 8.6-50 MG PO TABS
2.0000 | ORAL_TABLET | ORAL | Status: DC
  Administered 2019-09-23: 2 via ORAL
  Filled 2019-09-22: qty 2

## 2019-09-22 MED ORDER — PRENATAL MULTIVITAMIN CH
1.0000 | ORAL_TABLET | Freq: Every day | ORAL | Status: DC
  Administered 2019-09-22: 1 via ORAL
  Filled 2019-09-22: qty 1

## 2019-09-22 MED ORDER — MEASLES, MUMPS & RUBELLA VAC IJ SOLR: 0.5000 mL | Freq: Once | INTRAMUSCULAR | Status: DC

## 2019-09-22 MED ORDER — DIPHENHYDRAMINE HCL 25 MG PO CAPS: 25.0000 mg | ORAL_CAPSULE | Freq: Four times a day (QID) | ORAL | Status: DC | PRN

## 2019-09-22 NOTE — Progress Notes (Signed)
Post Partum Day 1 Subjective: Patient reports feeling well. She is tolerating PO. Ambulating and urinating without difficulty. Lochia minimal.  Objective: Blood pressure 135/80, pulse 79, temperature 98.4 F (36.9 C), temperature source Oral, resp. rate 18, height 5\' 8"  (1.727 m), weight 86.7 kg, SpO2 100 %, unknown if currently breastfeeding.  Physical Exam:  General: alert, cooperative and no distress Lochia: appropriate Uterine Fundus: firm Incision: NA DVT Evaluation: No evidence of DVT seen on physical exam.  Recent Labs    09/21/19 1914  HGB 8.9*  HCT 30.7*    Assessment/Plan: Plan for discharge tomorrow  PO iron initiated for baseline anemia Mild Pre-E: cont to monitor BP's for anti-hypertensive need Formula feeding Undecided for contraception   LOS: 1 day   Aarush Stukey N Tamyia Minich 09/22/2019, 6:12 AM

## 2019-09-23 MED ORDER — AMLODIPINE BESYLATE 5 MG PO TABS: 5.0000 mg | ORAL_TABLET | Freq: Every day | ORAL | 0 refills | Status: DC

## 2019-09-23 MED ORDER — SENNOSIDES-DOCUSATE SODIUM 8.6-50 MG PO TABS: 2.0000 | ORAL_TABLET | ORAL | 0 refills | Status: DC

## 2019-09-23 MED ORDER — FERROUS SULFATE 325 (65 FE) MG PO TABS: 325.0000 mg | ORAL_TABLET | Freq: Every day | ORAL | 0 refills | Status: DC

## 2019-09-23 MED ORDER — IBUPROFEN 600 MG PO TABS: 600.0000 mg | ORAL_TABLET | Freq: Three times a day (TID) | ORAL | 0 refills | Status: DC | PRN

## 2019-09-23 MED FILL — SENEXON-S 8.6-50 MG TABS: 8.6-50 | 15 days supply | Qty: 30 | Fill #0

## 2019-09-23 MED FILL — FERROUS SULFATE 325 MG TAB: 325 (65 FE) | 30 days supply | Qty: 30 | Fill #0

## 2019-09-23 MED FILL — AMLODIPINE BESYLATE 5 MG TA: 5 | 60 days supply | Qty: 60 | Fill #0

## 2019-09-23 MED FILL — IBUPROFEN 600 MG TABLET: 600 | 10 days supply | Qty: 30 | Fill #0

## 2019-09-23 NOTE — Progress Notes (Signed)
CSW consulted as there were potential questions regarding FOB assisting MOB with infant. CSW went to speak with MOB and FOB at bedside to offer further support and address further needs.   CSW entered the room and congratulated MOB on the birth of infant. CSW noted that FOB was not in the  room, therefore CSW inquired from [facility] on where FOB had went. MOB reported that FOB had left to get carseat from the car. CSW understanding and asked MOB if it was okay for CSW to just check in with her, MOB agreeable. MOB reported that she has been feeling very tired since she gave birth. CSW asked if FOB has been helping MOB to decrease her tiredness in which MOB reported "he has". MOB reported that FOB has been doing care for infant at times to help her. CSW understanding and asked MOB about her other children. MOB reported that she has two other daughters age 64 and 2. MOB chuckled as "yup I have all girls". CSW expressed joy over this for MOB and asked MOB about her supports to care for her children. MOB reported that she has FOB and his family. MOB expressed to this CSW that she has no mental health hx and denies PPD. MOB was still given education on PPD and SIDS. MOB was given PPD Checklist to keep track of her feelings as they relate to PPD. MOB reported that she has all needed items to care for infant at this time with follow up care at [facility]. MOB thanked CSW for coming by and reported no SI, HI, DV prior to this CSW leaving the room.   CSW noted no other concerns as MOB was appropriate and reported that FOB is very helpful in the care of infant.     Claude Manges Jaliyah Fotheringham, MSW, LCSW [facility] [phone]

## 2019-09-23 NOTE — Discharge Instructions (Signed)
Hypertension During Pregnancy Hypertension is also called high blood pressure. High blood pressure means that the force of your blood moving in your body is too strong. It can cause problems for you and your baby. Different types of high blood pressure can happen during pregnancy. The types are:  High blood pressure before you got pregnant. This is called chronic hypertension.  This can continue during your pregnancy. Your doctor will want to keep checking your blood pressure. You may need medicine to keep your blood pressure under control while you are pregnant. You will need follow-up visits after you have your baby.  High blood pressure that goes up during pregnancy when it was normal before. This is called gestational hypertension. It will usually get better after you have your baby, but your doctor will need to watch your blood pressure to make sure that it is getting better.  Very high blood pressure during pregnancy. This is called preeclampsia. Very high blood pressure is an emergency that needs to be checked and treated right away.  You may develop very high blood pressure after giving birth. This is called postpartum preeclampsia. This usually occurs within 48 hours after childbirth but may occur up to 6 weeks after giving birth. This is rare. How does this affect me? If you have high blood pressure during pregnancy, you have a higher chance of developing high blood pressure:  As you get older.  If you get pregnant again. In some cases, high blood pressure during pregnancy can cause:  Stroke.  Heart attack.  Damage to the kidneys, lungs, or liver.  Preeclampsia.  Jerky movements you cannot control (convulsions or seizures).  Problems with the placenta. How does this affect my baby? Your baby may:  Be born early.  Not weigh as much as he or she should.  Not handle labor well, leading to a c-section birth. What are the risks?  Having high blood pressure during a past  pregnancy.  Being overweight.  Being 35 years old or older.  Being pregnant for the first time.  Being pregnant with more than one baby.  Becoming pregnant using fertility methods, such as IVF.  Having other problems, such as diabetes, or kidney disease.  Having family members who have high blood pressure. What can I do to lower my risk?   Keep a healthy weight.  Eat a healthy diet.  Follow what your doctor tells you about treating any medical problems that you had before becoming pregnant. It is very important to go to all of your doctor visits. Your doctor will check your blood pressure and make sure that your pregnancy is progressing as it should. Treatment should start early if a problem is found. How is this treated? Treatment for high blood pressure during pregnancy can differ depending on the type of high blood pressure you have and how serious it is.  You may need to take blood pressure medicine.  If you have been taking medicine for your blood pressure, you may need to change the medicine during pregnancy if it is not safe for your baby.  If your doctor thinks that you could get very high blood pressure, he or she may tell you to take a low-dose aspirin during your pregnancy.  If you have very high blood pressure, you may need to stay in the hospital so you and your baby can be watched closely. You may also need to take medicine to lower your blood pressure. This medicine may be given by mouth   or through an IV tube.  In some cases, if your condition gets worse, you may need to have your baby early. Follow these instructions at home: Eating and drinking   Drink enough fluid to keep your pee (urine) pale yellow.  Avoid caffeine. Lifestyle  Do not use any products that contain nicotine or tobacco, such as cigarettes, e-cigarettes, and chewing tobacco. If you need help quitting, ask your doctor.  Do not use alcohol or drugs.  Avoid stress.  Rest and get plenty  of sleep.  Regular exercise can help. Ask your doctor what kinds of exercise are best for you. General instructions  Take over-the-counter and prescription medicines only as told by your doctor.  Keep all prenatal and follow-up visits as told by your doctor. This is important. Contact a doctor if:  You have symptoms that your doctor told you to watch for, such as: ? Headaches. ? Nausea. ? Vomiting. ? Belly (abdominal) pain. ? Dizziness. ? Light-headedness. Get help right away if:  You have: ? Very bad belly pain that does not get better with treatment. ? A very bad headache that does not get better. ? Vomiting that does not get better. ? Sudden, fast weight gain. ? Sudden swelling in your hands, ankles, or face. ? Bleeding from your vagina. ? Blood in your pee. ? Blurry vision. ? Double vision. ? Shortness of breath. ? Chest pain. ? Weakness on one side of your body. ? Trouble talking.  Your baby is not moving as much as usual. Summary  High blood pressure is also called hypertension.  High blood pressure means that the force of your blood moving in your body is too strong.  High blood pressure can cause problems for you and your baby.  Keep all follow-up visits as told by your doctor. This is important. This information is not intended to replace advice given to you by your health care provider. Make sure you discuss any questions you have with your health care provider. Document Revised: 06/25/2018 Document Reviewed: 03/31/2018 Elsevier Patient Education  2020 [employer] Postpartum Care After Vaginal Delivery This sheet gives you information about how to care for yourself from the time you deliver your baby to up to 6-12 weeks after delivery (postpartum period). Your health care provider may also give you more specific instructions. If you have problems or questions, contact your health care provider. Follow these instructions at home: Vaginal bleeding  It is  normal to have vaginal bleeding (lochia) after delivery. Wear a sanitary pad for vaginal bleeding and discharge. ? During the first week after delivery, the amount and appearance of lochia is often similar to a menstrual period. ? Over the next few weeks, it will gradually decrease to a dry, yellow-brown discharge. ? For most women, lochia stops completely by 4-6 weeks after delivery. Vaginal bleeding can vary from woman to woman.  Change your sanitary pads frequently. Watch for any changes in your flow, such as: ? A sudden increase in volume. ? A change in color. ? Large blood clots.  If you pass a blood clot from your vagina, save it and call your health care provider to discuss. Do not flush blood clots down the toilet before talking with your health care provider.  Do not use tampons or douches until your health care provider says this is safe.  If you are not breastfeeding, your period should return 6-8 weeks after delivery. If you are feeding your child breast milk only (exclusive breastfeeding), your  period may not return until you stop breastfeeding. Perineal care  Keep the area between the vagina and the anus (perineum) clean and dry as told by your health care provider. Use medicated pads and pain-relieving sprays and creams as directed.  If you had a cut in the perineum (episiotomy) or a tear in the vagina, check the area for signs of infection until you are healed. Check for: ? More redness, swelling, or pain. ? Fluid or blood coming from the cut or tear. ? Warmth. ? Pus or a bad smell.  You may be given a squirt bottle to use instead of wiping to clean the perineum area after you go to the bathroom. As you start healing, you may use the squirt bottle before wiping yourself. Make sure to wipe gently.  To relieve pain caused by an episiotomy, a tear in the vagina, or swollen veins in the anus (hemorrhoids), try taking a warm sitz bath 2-3 times a day. A sitz bath is a warm water  bath that is taken while you are sitting down. The water should only come up to your hips and should cover your buttocks. Breast care  Within the first few days after delivery, your breasts may feel heavy, full, and uncomfortable (breast engorgement). Milk may also leak from your breasts. Your health care provider can suggest ways to help relieve the discomfort. Breast engorgement should go away within a few days.  If you are breastfeeding: ? Wear a bra that supports your breasts and fits you well. ? Keep your nipples clean and dry. Apply creams and ointments as told by your health care provider. ? You may need to use breast pads to absorb milk that leaks from your breasts. ? You may have uterine contractions every time you breastfeed for up to several weeks after delivery. Uterine contractions help your uterus return to its normal size. ? If you have any problems with breastfeeding, work with your health care provider or Advertising copywriter.  If you are not breastfeeding: ? Avoid touching your breasts a lot. Doing this can make your breasts produce more milk. ? Wear a good-fitting bra and use cold packs to help with swelling. ? Do not squeeze out (express) milk. This causes you to make more milk. Intimacy and sexuality  Ask your health care provider when you can engage in sexual activity. This may depend on: ? Your risk of infection. ? How fast you are healing. ? Your comfort and desire to engage in sexual activity.  You are able to get pregnant after delivery, even if you have not had your period. If desired, talk with your health care provider about methods of birth control (contraception). Medicines  Take over-the-counter and prescription medicines only as told by your health care provider.  If you were prescribed an antibiotic medicine, take it as told by your health care provider. Do not stop taking the antibiotic even if you start to feel better. Activity  Gradually return to  your normal activities as told by your health care provider. Ask your health care provider what activities are safe for you.  Rest as much as possible. Try to rest or take a nap while your baby is sleeping. Eating and drinking   Drink enough fluid to keep your urine pale yellow.  Eat high-fiber foods every day. These may help prevent or relieve constipation. High-fiber foods include: ? Whole grain cereals and breads. ? Brown rice. ? Beans. ? Fresh fruits and vegetables.  Do not  try to lose weight quickly by cutting back on calories.  Take your prenatal vitamins until your postpartum checkup or until your health care provider tells you it is okay to stop. Lifestyle  Do not use any products that contain nicotine or tobacco, such as cigarettes and e-cigarettes. If you need help quitting, ask your health care provider.  Do not drink alcohol, especially if you are breastfeeding. General instructions  Keep all follow-up visits for you and your baby as told by your health care provider. Most women visit their health care provider for a postpartum checkup within the first 3-6 weeks after delivery. Contact a health care provider if:  You feel unable to cope with the changes that your child brings to your life, and these feelings do not go away.  You feel unusually sad or worried.  Your breasts become red, painful, or hard.  You have a fever.  You have trouble holding urine or keeping urine from leaking.  You have little or no interest in activities you used to enjoy.  You have not breastfed at all and you have not had a menstrual period for 12 weeks after delivery.  You have stopped breastfeeding and you have not had a menstrual period for 12 weeks after you stopped breastfeeding.  You have questions about caring for yourself or your baby.  You pass a blood clot from your vagina. Get help right away if:  You have chest pain.  You have difficulty breathing.  You have sudden,  severe leg pain.  You have severe pain or cramping in your lower abdomen.  You bleed from your vagina so much that you fill more than one sanitary pad in one hour. Bleeding should not be heavier than your heaviest period.  You develop a severe headache.  You faint.  You have blurred vision or spots in your vision.  You have bad-smelling vaginal discharge.  You have thoughts about hurting yourself or your baby. If you ever feel like you may hurt yourself or others, or have thoughts about taking your own life, get help right away. You can go to the nearest emergency department or call:  Your local emergency services (911 in the [place]  A suicide crisis helpline, such as the National Suicide Prevention Lifeline at 1-800-273-8255. This is open 24 hours a day. Summary  The period of time right after you deliver your newborn up to 6-12 weeks after delivery is called the postpartum period.  Gradually return to your normal activities as told by your health care provider.  Keep all follow-up visits for you and your baby as told by your health care provider. This information is not intended to replace advice given to you by your health care provider. Make sure you discuss any questions you have with your health care provider. Document Revised: 03/07/2017 Document Reviewed: 12/16/2016 Elsevier Patient Education  2020 Elsevier Inc.  

## 2021-03-16 IMAGING — US US OB < 14 WEEKS - US OB TV
1 series · 14 of 28 positions shown · non-contrast
Comparison: None.

CLINICAL DATA: Positive pregnancy test

EXAM:
OBSTETRIC <14 WK US AND TRANSVAGINAL OB US
TECHNIQUE: Both transabdominal and transvaginal ultrasound examinations were
performed for complete evaluation of the gestation as well as the
maternal uterus, adnexal regions, and pelvic cul-de-sac.
Transvaginal technique was performed to assess early pregnancy.

[Series 1: us ob < 14 weeks - us ob tv · 14 of 106 slices shown]
[im 4/106]
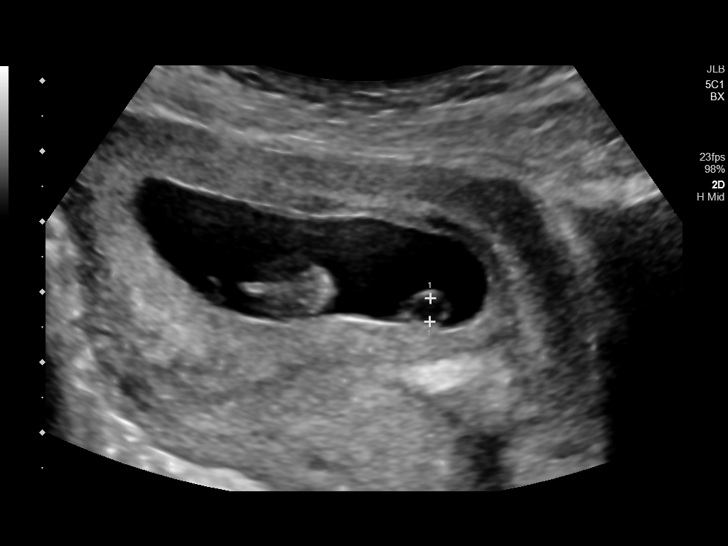
[im 12/106]
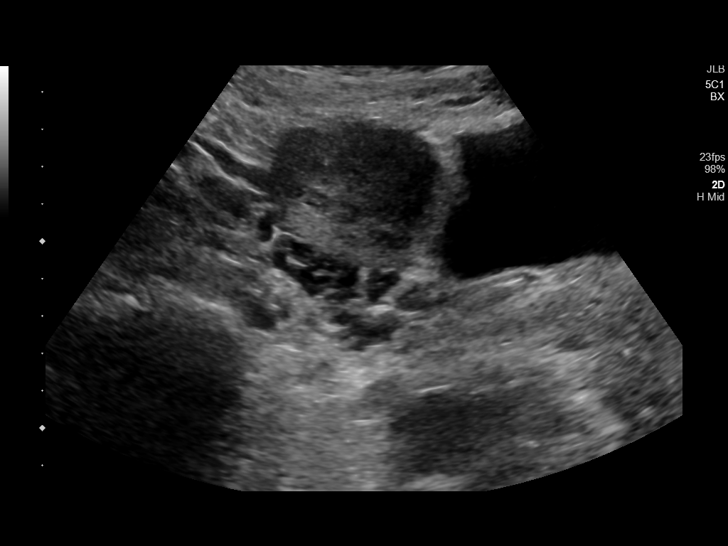
[im 20/106]
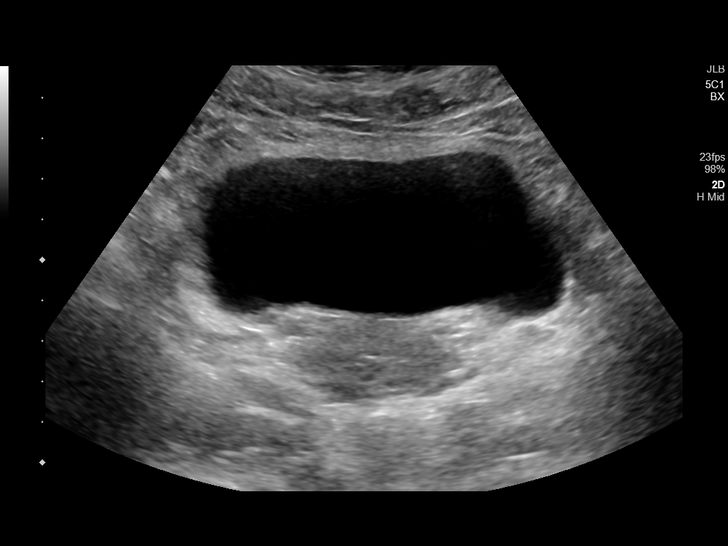
[im 28/106]
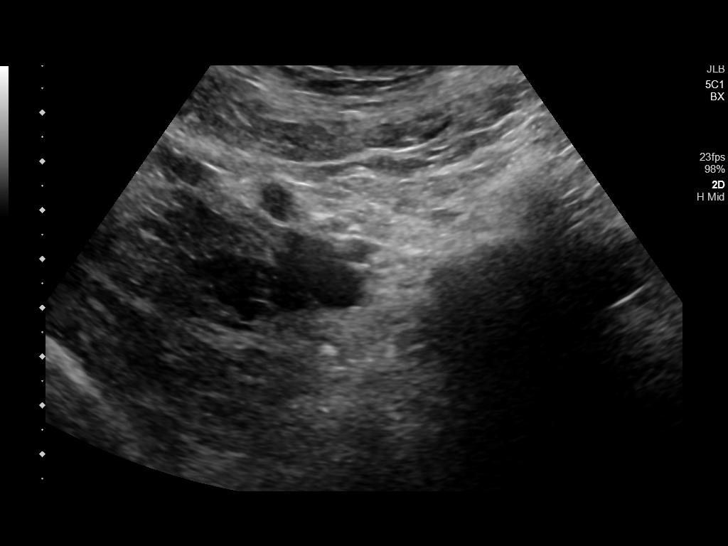
[im 36/106]
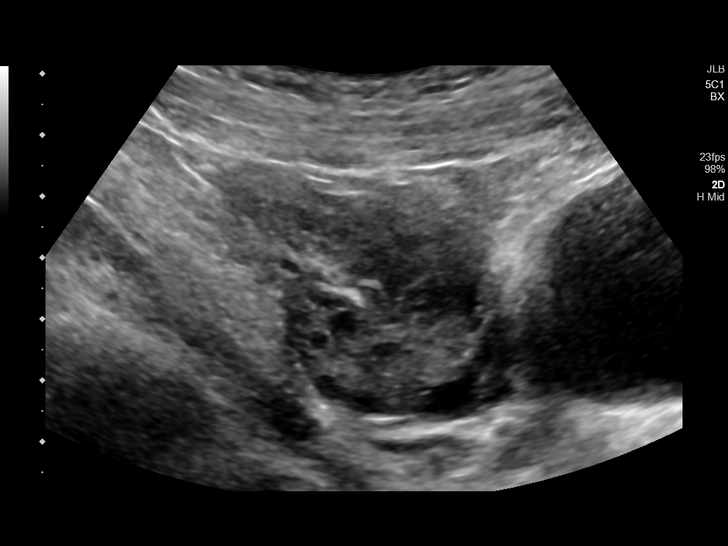
[im 43/106]
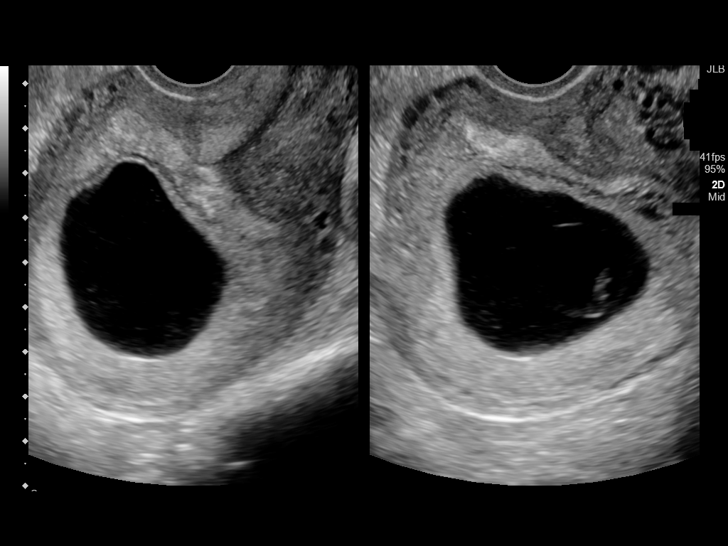
[im 51/106]
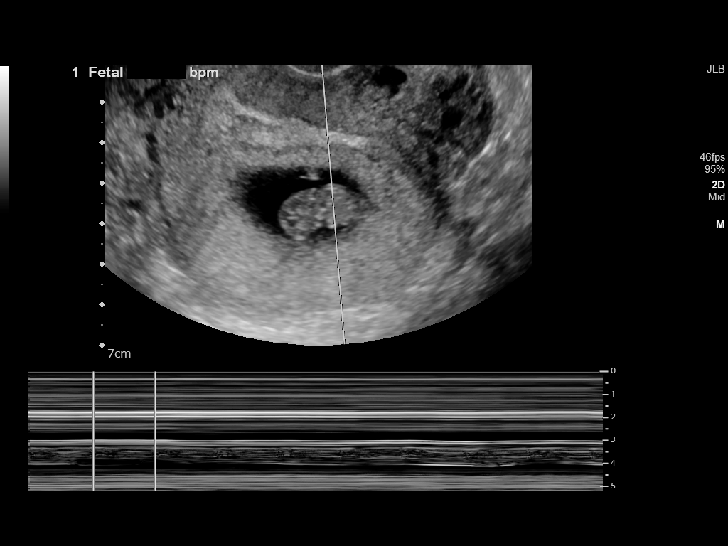
[im 59/106]
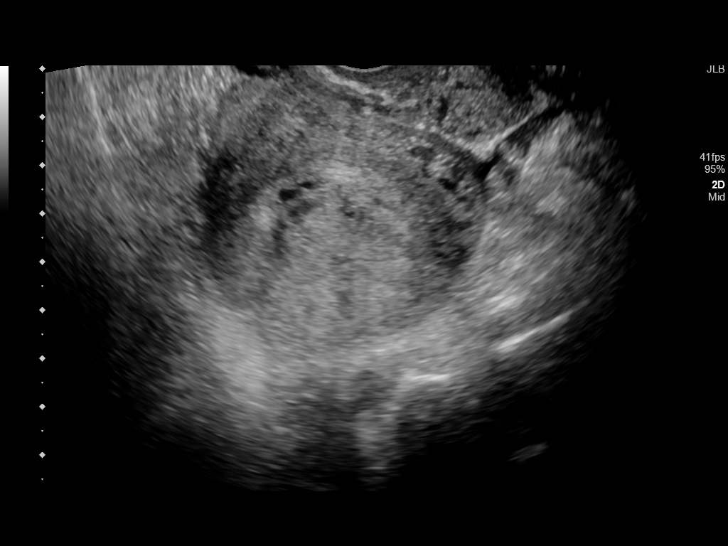
[im 67/106]
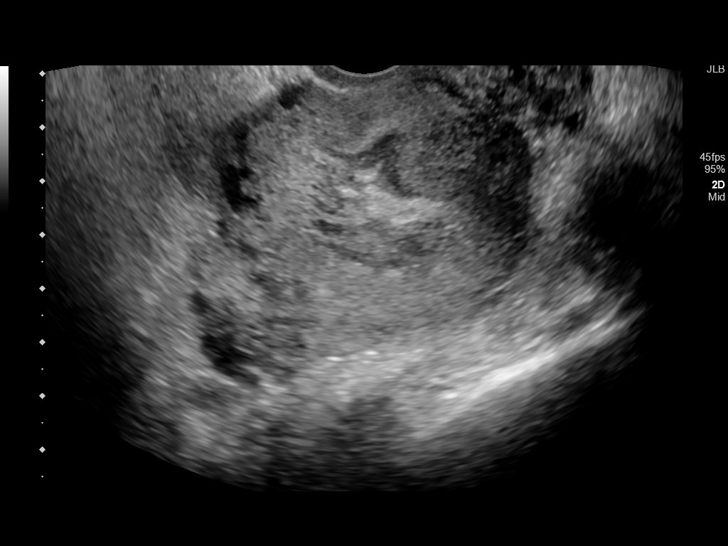
[im 74/106]
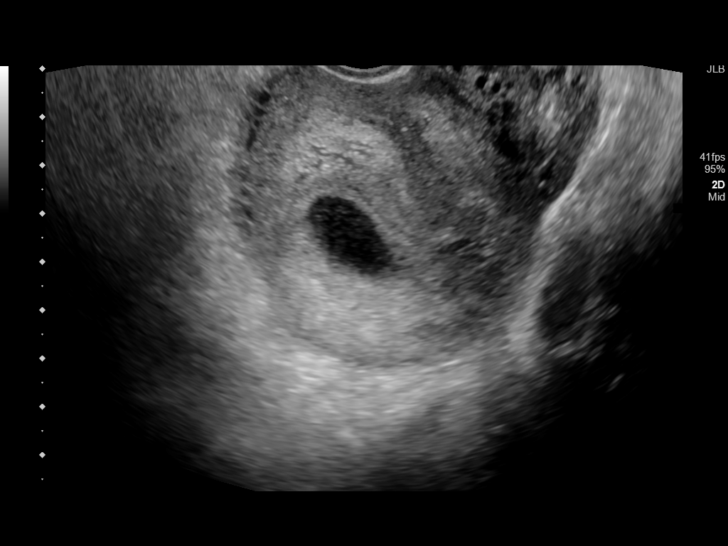
[im 82/106]
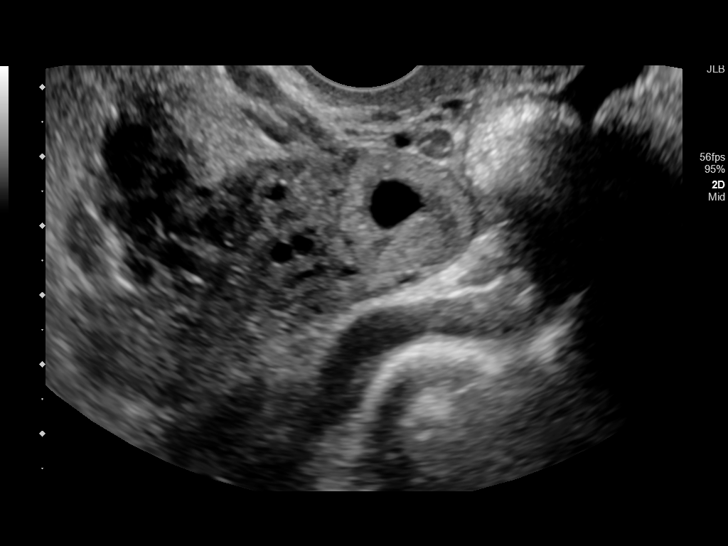
[im 90/106]
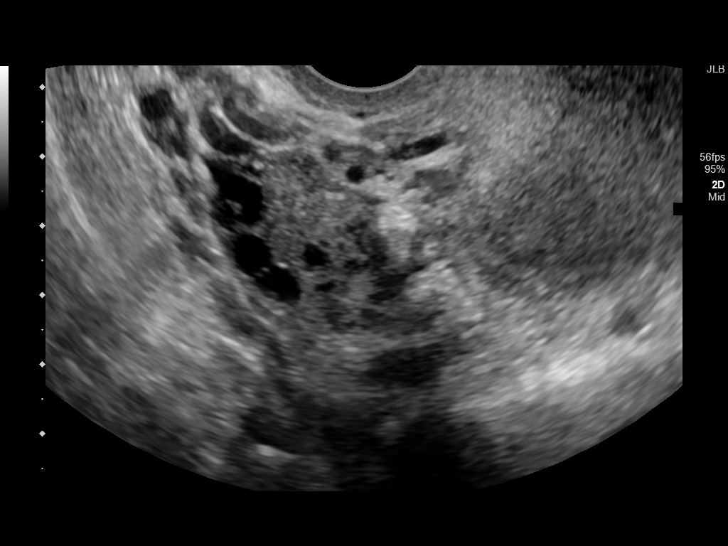
[im 98/106]
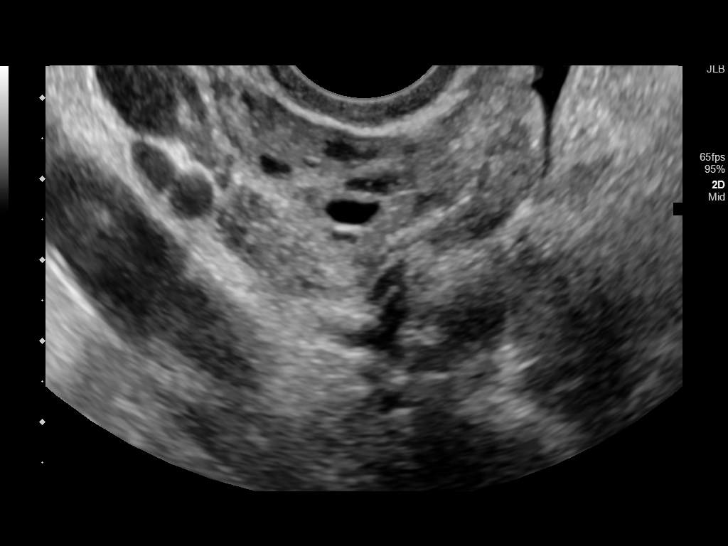
[im 106/106]
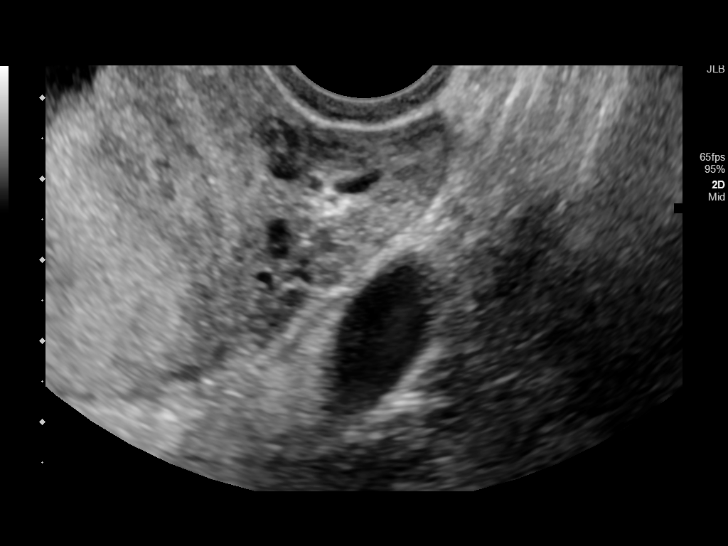

[14 of 28 positions shown; findings below may reference images not displayed]

FINDINGS: Intrauterine gestational sac: Single

Yolk sac:  Visualized

Embryo:  Visualized

Cardiac Activity: Visualized

Heart Rate: 173 bpm

MSD: 42.4 mm   9 w   5 d

CRL:  22.9 mm   9 w   0 d                  US EDC: 09/23/2019

Subchorionic hemorrhage: Small subchorionic hemorrhage at the lower
uterine segment.

Maternal uterus/adnexae: Ovaries are within normal limits. Right
ovary measures 3.2 x 2 x 2.4 cm. The left ovary measures 2.6 x 1.8 x
1.9 cm. There is a corpus luteum in the right ovary. Trace free
fluid at the adnexa.
IMPRESSION: 1. Single viable intrauterine pregnancy with estimated sonographic
age of 9 weeks 0 days and ultrasound EDC of 09/23/2019.
2. Small subchorionic hemorrhage
3. Trace free fluid in the pelvis

## 2021-05-22 IMAGING — US US OB COMP +14 WK
1 series · 13 of 28 positions shown · non-contrast
Comparison: none

CLINICAL DATA: Scan for anatomy. Patient is 18-year-old gravida 3
para 2. EDC by early ultrasound is 09/23/2019.

EXAM:
OBSTETRICAL ULTRASOUND >14 WKS

[Series 1: us ob comp +14 wk · 0.20mm/px · 13 of 84 slices shown]
[im 4/84]
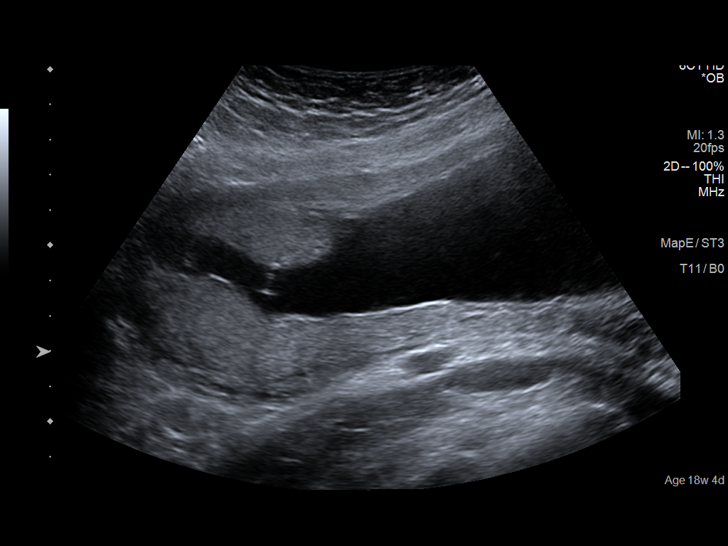
[im 10/84]
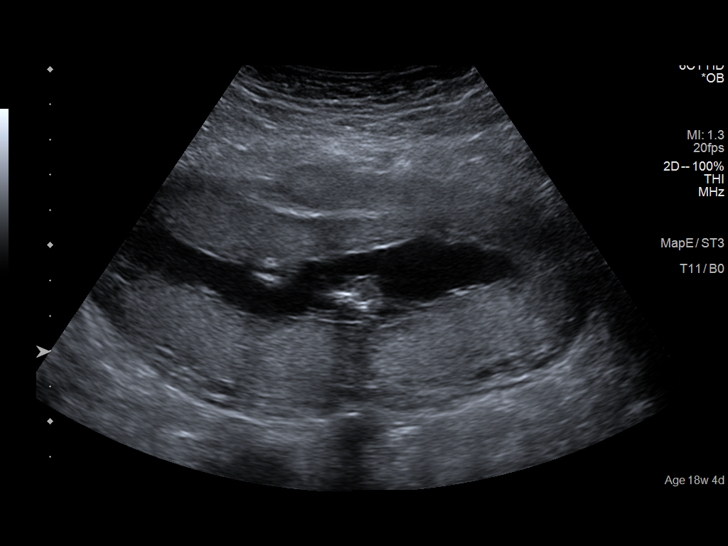
[im 16/84]
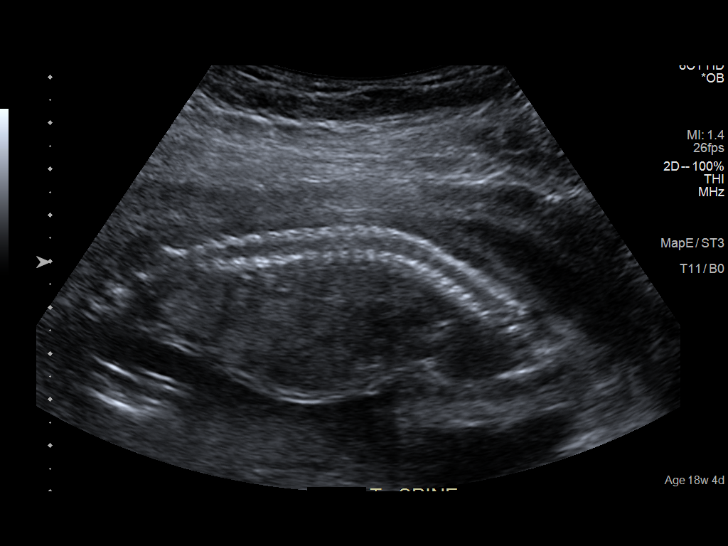
[im 22/84]
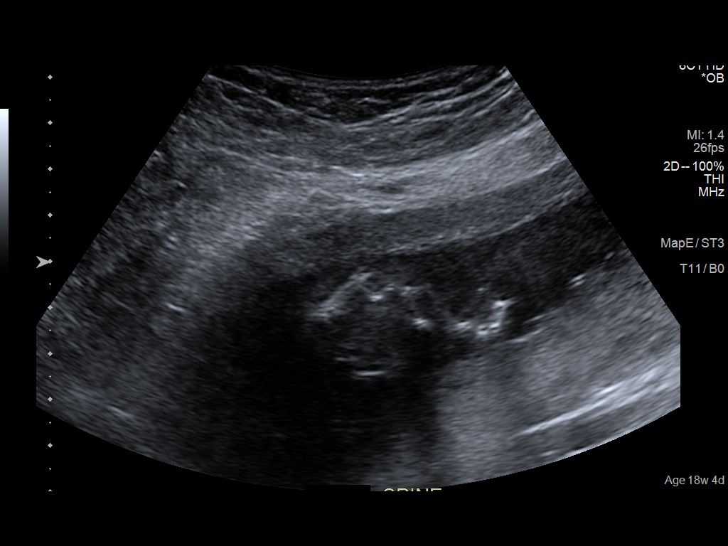
[im 28/84]
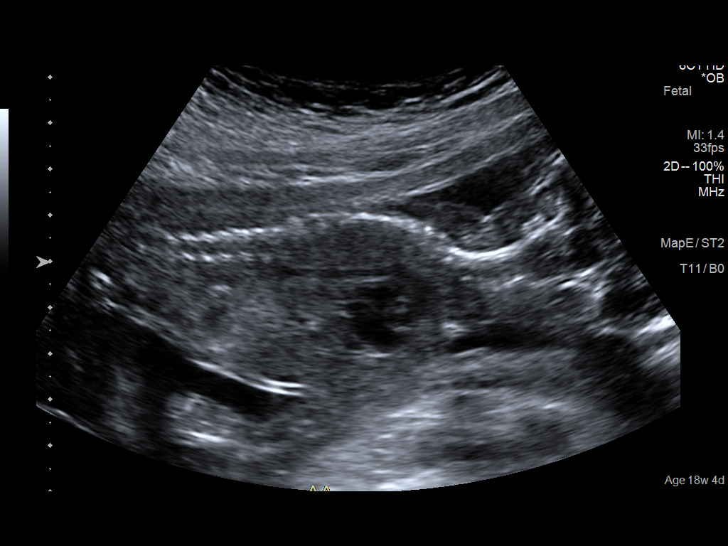
[im 34/84]
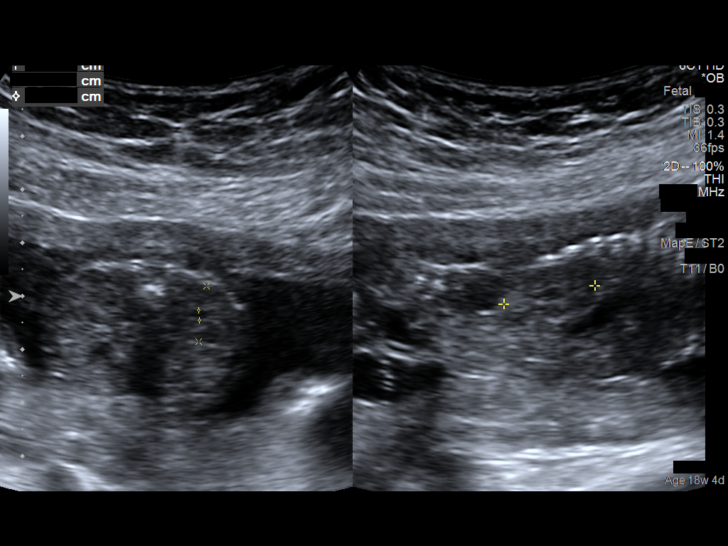
[im 44/84]
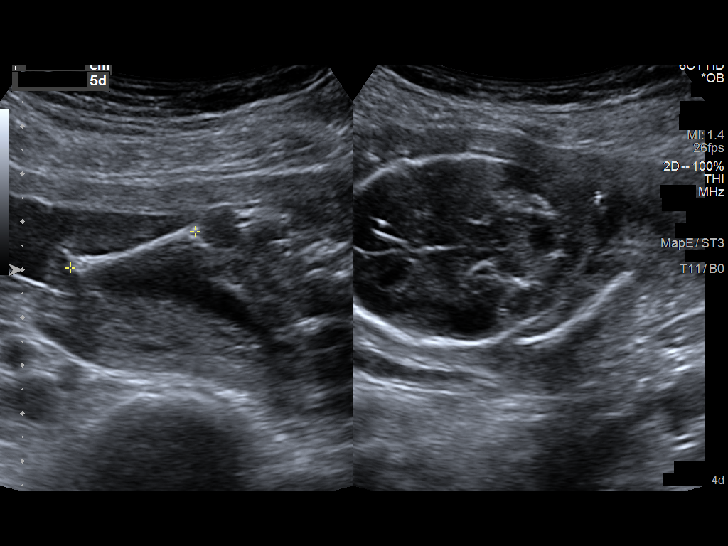
[im 50/84]
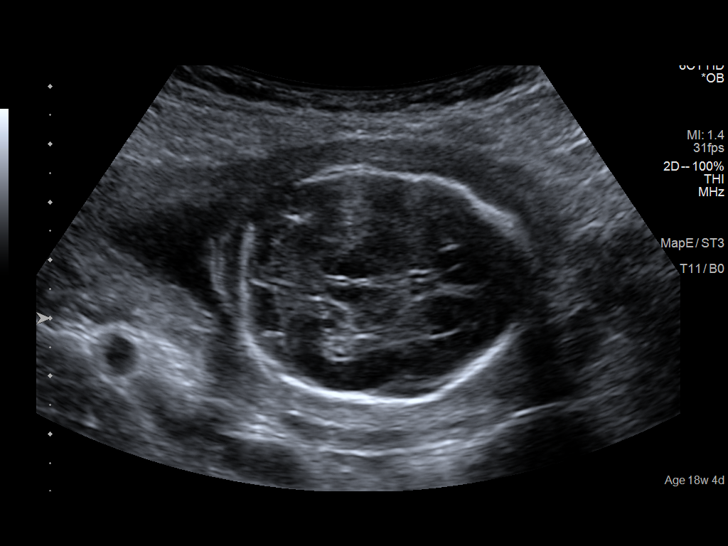
[im 56/84]
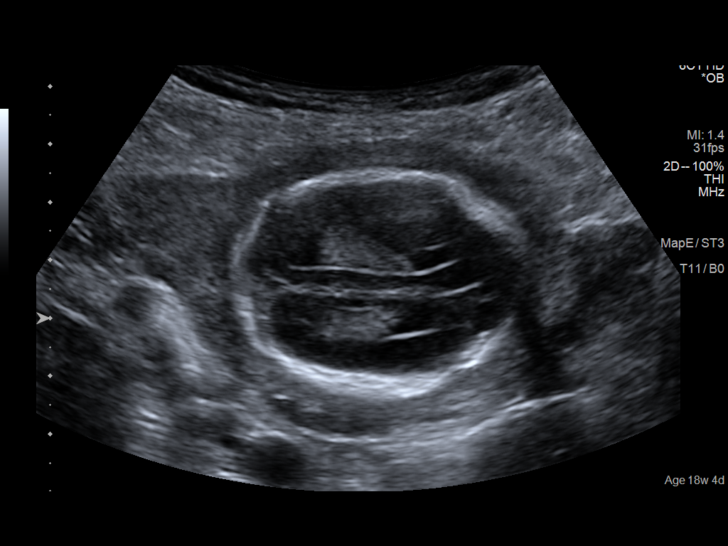
[im 62/84]
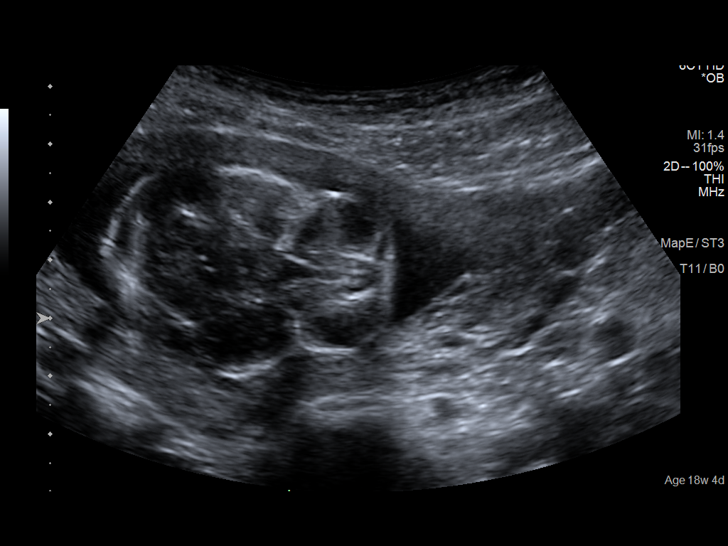
[im 68/84]
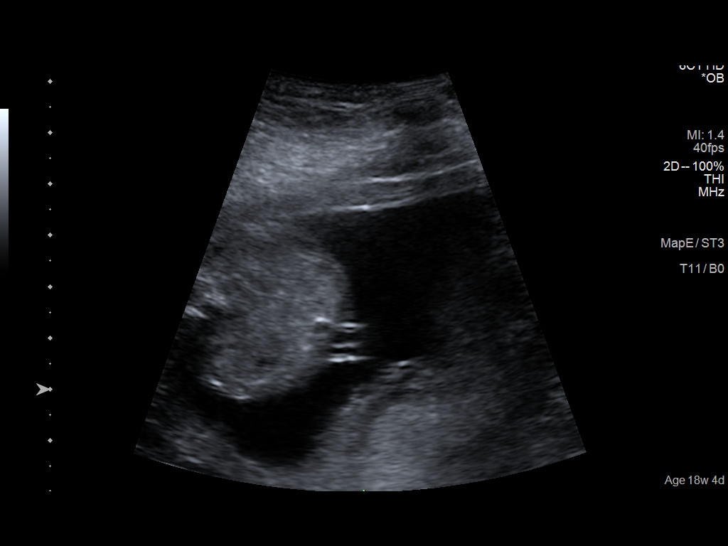
[im 74/84]
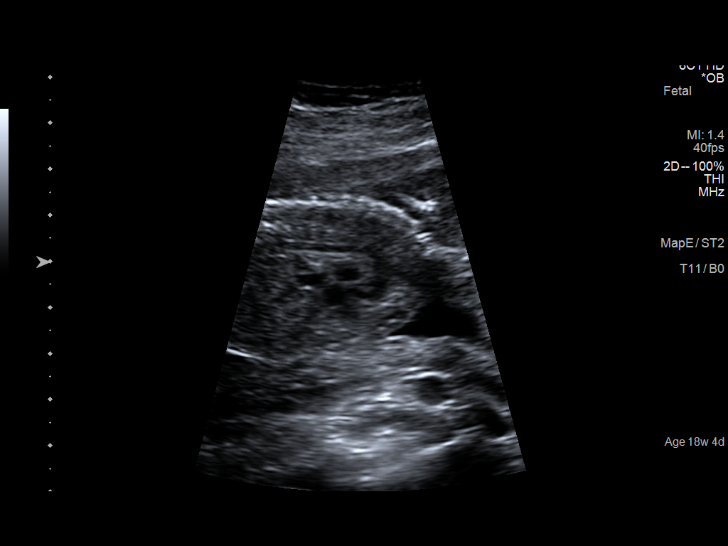
[im 80/84]
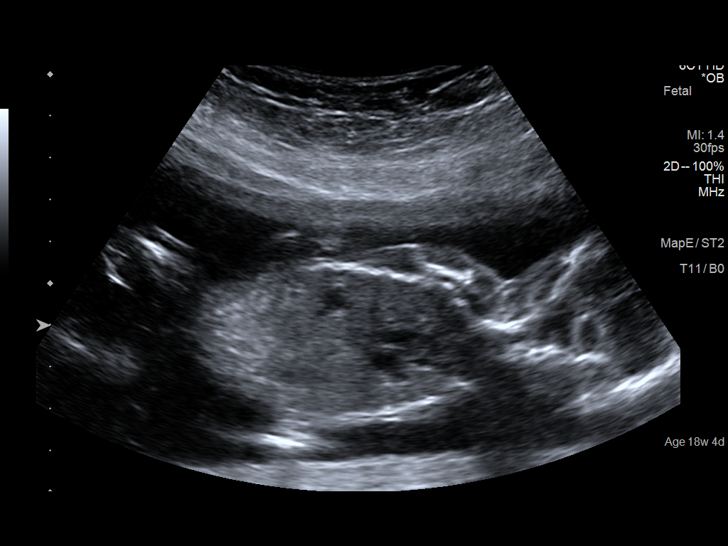

[13 of 28 positions shown; findings below may reference images not displayed]

FINDINGS: Number of Fetuses: 1

Heart Rate:  135 bpm

Movement: Present

Presentation: Cephalic

Previa: None

Placental Location: Fundal and posterior

Amniotic Fluid (Subjective): Normal

Amniotic Fluid (Objective):

Vertical pocket = 2.2cm

FETAL BIOMETRY

BPD: 3.9cm 17w 5d

HC:   15.4cm 18w 3d

AC:   12.4cm 18w 0d

FL:   2.9cm 18w 6d

Current Mean GA: 18w 2d US EDC: 09/25/2019

Assigned GA:  18w 4d Assigned EDC: 09/23/2019

FETAL ANATOMY

Lateral Ventricles: Appears normal

Thalami/CSP: Appears normal

Posterior Fossa:  Appears normal

Nuchal Region: Appears normal   NFT=

Upper Lip: Not visualized

Spine: Appears normal

4 Chamber Heart on Left: Limited views appear normal

LVOT: Not visualized

RVOT: Not visualized

Stomach on Left: Appears normal

3 Vessel Cord: Appears normal

Cord Insertion site: Appears normal

Kidneys: Appears normal

Bladder: Appears normal

Extremities: Appears normal

Technically difficult due to: Fetal position

Maternal Findings:

Cervix:  4.3 centimeter
IMPRESSION: 1. Single living intrauterine fetus in cephalic presentation.
2. Size and dates correlate well.
3. Visualized anatomy is unremarkable.
4. Views of the face and heart are limited because of fetal
position.

## 2023-08-31 ENCOUNTER — Telehealth: Admitting: Family Medicine

## 2023-08-31 DIAGNOSIS — N3000 Acute cystitis without hematuria: Secondary | ICD-10-CM | POA: Diagnosis not present

## 2023-08-31 MED ORDER — CEPHALEXIN 500 MG PO CAPS: 500.0000 mg | ORAL_CAPSULE | Freq: Two times a day (BID) | ORAL | 0 refills | Status: AC

## 2023-08-31 NOTE — Progress Notes (Signed)

## 2023-08-31 NOTE — Addendum Note (Signed)
 Addended by: Albertha Huger on: 08/31/2023 09:07 AM   Modules accepted: Orders

## 2024-02-09 ENCOUNTER — Ambulatory Visit: Payer: Self-pay

## 2024-02-09 ENCOUNTER — Ambulatory Visit (LOCAL_COMMUNITY_HEALTH_CENTER): Payer: Self-pay

## 2024-02-09 VITALS — BP 123/76 | HR 85 | Ht 70.0 in | Wt 175.8 lb

## 2024-02-09 DIAGNOSIS — Z3009 Encounter for other general counseling and advice on contraception: Secondary | ICD-10-CM

## 2024-02-09 DIAGNOSIS — Z30016 Encounter for initial prescription of transdermal patch hormonal contraceptive device: Secondary | ICD-10-CM

## 2024-02-09 DIAGNOSIS — Z3046 Encounter for surveillance of implantable subdermal contraceptive: Secondary | ICD-10-CM

## 2024-02-09 MED ORDER — NORELGESTROMIN-ETH ESTRADIOL 150-35 MCG/24HR TD PTWK: 1.0000 | MEDICATED_PATCH | TRANSDERMAL | 12 refills | Status: DC

## 2024-02-09 MED ORDER — NORELGESTROMIN-ETH ESTRADIOL 150-35 MCG/24HR TD PTWK: 1.0000 | MEDICATED_PATCH | TRANSDERMAL | 0 refills | Status: AC

## 2024-02-09 NOTE — Progress Notes (Signed)
 [facility]PART[facility] [address] [place] [address] Main phone: [phone]  Family Planning Visit - Initial Visit  Subjective:  Kayla Poole is a 23 y.o.  [phone]   being seen today for an initial annual visit and to discuss reproductive life planning.  The patient is currently using hormonal implant for pregnancy prevention. Patient does not want a pregnancy in the next year.   Patient reports they are looking for a method with the following characteristics:  Does not involve insertion  Ready when they are Method they can control starting and stopping  Patient has the following medical conditions: Patient Active Problem List   Diagnosis Date Noted   Preeclampsia 09/22/2019   PROM (premature rupture of membranes) 09/21/2019    Chief Complaint  Patient presents with   Annual Exam    Physical-Nexplanon removal-wants to start another method-possibly patch    HPI Patient reports desire for Nexplanon removal and she wants to try the patch.  No history of migraine headaches, hypertension, DVT/PE, and she does not smoke.   Patient denies other concerns today. Declines STI testing.  Declines pap today - does not want a pelvic exam today.    Review of Systems  All other systems reviewed and are negative.  Diabetes screening This patient is 23 y.o. with a BMI of Body mass index is 25.22 kg/m.SABRA  Is patient eligible for diabetes screening (age >35 and BMI >25)?  no  Was Hgb A1c ordered? no  STI screening Patient reports 1 of partners in last year.  Does this patient desire STI screening?  No - declines Declines all blood tests.  Cervical Cancer Screening  Declines today Never had pap - discussed purpose of pap testing  Health Maintenance Due  Topic Date Due   HPV VACCINES (1 - 3-dose series) Never done   Hepatitis C Screening  Never done   DTaP/Tdap/Td (1 - Tdap) Never done   Hepatitis B Vaccines  19-59 Average Risk (1 of 3 - 19+ 3-dose series) Never done   Cervical Cancer Screening (Pap smear)  Never done   Influenza Vaccine  Never done   COVID-19 Vaccine (1 - 2025-26 season) Never done   The following portions of the patient's history were reviewed and updated as appropriate: allergies, current medications, past family history, past medical history, past social history, past surgical history and problem list. Problem list updated.  See flowsheet for further details and programmatic requirements Hyperlink available at the top of the signed note in blue.  Flow sheet content below:  Pregnancy Intention Screening Does the patient want to become pregnant in the next year?: No Does the patient's partner want to become pregnant in the next year?: No Would the patient like to discuss contraceptive options today?: Yes Other:  Password: green Is it okay to contact you by mail?: Yes Contraception History Past methods of contraception used by patient:: Hormonal Implant, Contraceptive Pill Adverse effects associated with Contraceptive Pill: forgot to take Adverse effects associated with Hormonal Implant: bad cramps Sexual History What age did you start your period?: 10 How often do you have your period?: monthly Date of last sex?: 02/08/24 Has the patient had unprotected sex within the last 5 days?: No Do you have sex with men, women, both men and women?: Men only In the past 2 months how many partners have you had sex with?: 1 In the past 12 months, how many partners have you had sex with?:  1 Is it possible that any of your sex partners in the past 12 months had sex with someone else whild they were still in a sexual relationship with you?: No What ways do you have sex?: Vaginal, Oral Do you or your partner use condoms and/or dental dams every time you have vaginal, oral or anal sex?: No Do you douche?: No Date of last HIV test?:  (2021) Have you ever had an STD?: No Have any of your  partners had an STD?: No Have you or your partner ever shot up drugs?: No Have any of your partners used drugs in the past?: No Have you or your partners exchanged money or drugs for sex?: No  Objective:   Vitals:   02/09/24 1507  BP: 123/76  Pulse: 85  Weight: 175 lb 12.8 oz (79.7 kg)  Height: 5' 10 (1.778 m)   Physical Exam Vitals and nursing note reviewed.  Constitutional:      Appearance: Normal appearance.  HENT:     Head: Normocephalic.     Mouth/Throat:     Mouth: Mucous membranes are moist.  Neck:     Thyroid: No thyroid mass, thyromegaly or thyroid tenderness.  Cardiovascular:     Rate and Rhythm: Normal rate.     Heart sounds: Normal heart sounds, S1 normal and S2 normal.  Pulmonary:     Effort: Pulmonary effort is normal.     Breath sounds: Normal breath sounds.  Abdominal:     Palpations: Abdomen is soft.  Genitourinary:    Comments: Declined genital exam- no symptoms, self swabbed Musculoskeletal:        General: Normal range of motion.     Cervical back: Neck supple.  Lymphadenopathy:     Cervical: No cervical adenopathy.     Upper Body:     Right upper body: No supraclavicular adenopathy.     Left upper body: No supraclavicular adenopathy.  Skin:    General: Skin is warm and dry.  Neurological:     Mental Status: She is alert and oriented to person, place, and time.  Psychiatric:        Mood and Affect: Mood normal.    Assessment and Plan:  Kayla Poole is a 23 y.o. female presenting to the [facility] Department for an initial annual wellness/contraceptive visit  Family planning  Contraception counseling:  Reviewed options based on patient desire and reproductive life plan. Patient is interested in Contraceptive Patch. This was provided to the patient today.  Risks, benefits, and typical effectiveness rates were reviewed.  Questions were answered.  Written information was also given to the patient to review.    The  patient will follow up in  3 months for surveillance.  The patient was told to call with any further questions, or with any concerns about this method of contraception.  Emphasized use of condoms 100% of the time for STI prevention.  Emergency Contraception Precautions (ECP): Patient assessed for need of ECP. She is not a candidate based on LARC in place and unexpired .   2. Encounter for initial prescription of transdermal patch hormonal contraceptive device (Primary)  - No contraindications to estrogen - norelgestromin -ethinyl estradiol  (XULANE) 150-35 MCG/24HR transdermal patch; Place 1 patch onto the skin once a week.  Dispense: 3 patch; Refill: 0 - Counseled to use back up method for 1 week and return in 3 months for follow up/refill for duration of year   3. Nexplanon removal  Procedure:  Nexplanon Removal  Patient identified, informed consent performed, consent signed.   Appropriate time out taken. Nexplanon site identified in the patient's left arm.  Area prepped in usual sterile fashon. 1 ml of 1% lidocaine  with Epinephrine was used to anesthetize the area at the distal end of the implant and along implant site. Patient could still feel a sharp sensation with the blade test, so another 0.5 mls of lidocaine  was injected. A small stab incision was made right beside the implant on the distal portion.  The Nexplanon rod was grasped using hemostats and removed without difficulty.  There was minimal blood loss. There were no complications.  Steri-strips were applied over the small incision.  A pressure bandage was applied to reduce any bruising.  The patient tolerated the procedure well and was given post procedure instructions.     Nexplanon:   Counseled patient to take OTC analgesic starting as soon as lidocaine  starts to wear off and take regularly for at least 48 hr to decrease discomfort.  Specifically to take with food or milk to decrease stomach upset and for IB 600 mg (3 tablets) every 6  hrs; IB 800 mg (4 tablets) every 8 hrs; or Aleve 2 tablets every 12 hrs.  Return in about 3 months (around 05/11/2024).  No future appointments.  Damien FORBES Satchel, NP

## 2024-02-09 NOTE — Progress Notes (Signed)
 Pt here for annual physical, Nexplanon removal, and initiation of birth control patch.  Pt declines pelvic exam and STI screening.  Xulane Patch #3 boxes (3mos) dispensed to patient.  Counseled re medication, side effects, plan of care and when to contact clinic with questions or concerns.  Verbalizes [name] Pry, RN

## 2024-04-21 ENCOUNTER — Telehealth: Payer: Self-pay | Admitting: Nurse Practitioner

## 2024-04-21 DIAGNOSIS — N939 Abnormal uterine and vaginal bleeding, unspecified: Secondary | ICD-10-CM

## 2024-04-21 NOTE — Progress Notes (Signed)
 Advised given abnormal vaginal bleeding and unsure if formal period has occurred, to take at home pregnancy test and f/u in person with her provider.   Please contact your primary care physician practice to be seen. Many offices offer virtual options to be seen via video if you are not comfortable going in person to a medical facility at this time.  NOTE: You will NOT be charged for this eVisit.  If you do not have a PCP, [facility] offers a free physician referral service available at [phone]. Our trained staff has the experience, knowledge and resources to put you in touch with a physician who is right for you.    If you are having a true medical emergency please call 911.   Your e-visit answers were reviewed by a board certified advanced clinical practitioner to complete your personal care plan.  Thank you for using e-Visits.
